# Patient Record
Sex: Male | Born: 1955
Health system: Southern US, Community
[De-identification: ages and names within clinical notes are randomized; demographics above are authoritative.]

## PROBLEM LIST (undated history)

## (undated) ENCOUNTER — Emergency Department (HOSPITAL_COMMUNITY): Admission: EM | Payer: Medicaid Other | Source: Home / Self Care

## (undated) DIAGNOSIS — B1920 Unspecified viral hepatitis C without hepatic coma: Secondary | ICD-10-CM

## (undated) DIAGNOSIS — Z8781 Personal history of (healed) traumatic fracture: Secondary | ICD-10-CM

## (undated) HISTORY — DX: Personal history of (healed) traumatic fracture: Z87.81

---

## 2015-05-20 ENCOUNTER — Ambulatory Visit: Payer: Self-pay | Attending: Internal Medicine

## 2016-03-23 ENCOUNTER — Ambulatory Visit: Payer: Self-pay | Attending: Internal Medicine

## 2016-07-06 ENCOUNTER — Encounter: Payer: Self-pay | Admitting: Family Medicine

## 2016-07-06 ENCOUNTER — Ambulatory Visit: Payer: Self-pay | Attending: Family Medicine | Admitting: Family Medicine

## 2016-07-06 VITALS — BP 112/62 | HR 95 | Temp 98.9°F | Resp 20 | Ht 64.0 in | Wt 148.0 lb

## 2016-07-06 DIAGNOSIS — Z0000 Encounter for general adult medical examination without abnormal findings: Secondary | ICD-10-CM | POA: Insufficient documentation

## 2016-07-06 DIAGNOSIS — E559 Vitamin D deficiency, unspecified: Secondary | ICD-10-CM

## 2016-07-06 DIAGNOSIS — Z590 Homelessness: Secondary | ICD-10-CM | POA: Insufficient documentation

## 2016-07-06 DIAGNOSIS — R768 Other specified abnormal immunological findings in serum: Secondary | ICD-10-CM

## 2016-07-06 DIAGNOSIS — Z23 Encounter for immunization: Secondary | ICD-10-CM

## 2016-07-06 DIAGNOSIS — K0889 Other specified disorders of teeth and supporting structures: Secondary | ICD-10-CM | POA: Insufficient documentation

## 2016-07-06 LAB — CBC WITH DIFFERENTIAL/PLATELET
BASOS PCT: 1 %
Basophils Absolute: 36 cells/uL (ref 0–200)
EOS ABS: 36 {cells}/uL (ref 15–500)
Eosinophils Relative: 1 %
HCT: 43.4 % (ref 38.5–50.0)
Hemoglobin: 14.9 g/dL (ref 13.2–17.1)
Lymphocytes Relative: 34 %
Lymphs Abs: 1224 cells/uL (ref 850–3900)
MCH: 31.8 pg (ref 27.0–33.0)
MCHC: 34.3 g/dL (ref 32.0–36.0)
MCV: 92.5 fL (ref 80.0–100.0)
MONO ABS: 360 {cells}/uL (ref 200–950)
MONOS PCT: 10 %
MPV: 10.3 fL (ref 7.5–12.5)
NEUTROS ABS: 1944 {cells}/uL (ref 1500–7800)
Neutrophils Relative %: 54 %
PLATELETS: 264 10*3/uL (ref 140–400)
RBC: 4.69 MIL/uL (ref 4.20–5.80)
RDW: 13.4 % (ref 11.0–15.0)
WBC: 3.6 10*3/uL — AB (ref 3.8–10.8)

## 2016-07-06 LAB — POCT URINALYSIS DIPSTICK
Bilirubin, UA: NEGATIVE
Glucose, UA: NEGATIVE
Ketones, UA: NEGATIVE
Leukocytes, UA: NEGATIVE
NITRITE UA: NEGATIVE
PH UA: 7.5
PROTEIN UA: NEGATIVE
RBC UA: NEGATIVE
SPEC GRAV UA: 1.02
UROBILINOGEN UA: 1

## 2016-07-06 LAB — BASIC METABOLIC PANEL
BUN: 10 mg/dL (ref 7–25)
CHLORIDE: 103 mmol/L (ref 98–110)
CO2: 29 mmol/L (ref 20–31)
CREATININE: 0.95 mg/dL (ref 0.70–1.33)
Calcium: 9.6 mg/dL (ref 8.6–10.3)
Glucose, Bld: 90 mg/dL (ref 65–99)
POTASSIUM: 4 mmol/L (ref 3.5–5.3)
Sodium: 139 mmol/L (ref 135–146)

## 2016-07-06 LAB — LIPID PANEL
Cholesterol: 254 mg/dL — ABNORMAL HIGH (ref <200)
HDL: 153 mg/dL (ref >40)
LDL Cholesterol: 89 mg/dL (ref <100)
TRIGLYCERIDES: 62 mg/dL (ref <150)
Total CHOL/HDL Ratio: 1.7 Ratio (ref <5.0)
VLDL: 12 mg/dL (ref <30)

## 2016-07-06 LAB — HEMOGLOBIN A1C
Hgb A1c MFr Bld: 5.3 % (ref <5.7)
MEAN PLASMA GLUCOSE: 105 mg/dL

## 2016-07-06 NOTE — Progress Notes (Signed)
Tooth concerns. Denies pain or issue.

## 2016-07-06 NOTE — Patient Instructions (Addendum)
Td Vaccine (Tetanus and Diphtheria): What You Need to Know 1. Why get vaccinated? Tetanus  and diphtheria are very serious diseases. They are rare in the [place] today, but people who do become infected often have severe complications. Td vaccine is used to protect adolescents and adults from both of these diseases. Both tetanus and diphtheria are infections caused by bacteria. Diphtheria spreads from person to person through coughing or sneezing. Tetanus-causing bacteria enter the body through cuts, scratches, or wounds. TETANUS (lockjaw) causes painful muscle tightening and stiffness, usually all over the body.  It can lead to tightening of muscles in the head and neck so you can't open your mouth, swallow, or sometimes even breathe. Tetanus kills about 1 out of every 10 people who are infected even after receiving the best medical care. DIPHTHERIA can cause a thick coating to form in the back of the throat.  It can lead to breathing problems, paralysis, heart failure, and death. Before vaccines, as many as 200,000 cases of diphtheria and hundreds of cases of tetanus were reported in the [place] each year. Since vaccination began, reports of cases for both diseases have dropped by about 99%. 2. Td vaccine Td vaccine can protect adolescents and adults from tetanus and diphtheria. Td is usually given as a booster dose every 10 years but it can also be given earlier after a severe and dirty wound or burn. Another vaccine, called Tdap, which protects against pertussis in addition to tetanus and diphtheria, is sometimes recommended instead of Td vaccine. Your doctor or the person giving you the vaccine can give you more information. Td may safely be given at the same time as other vaccines. 3. Some people should not get this vaccine  A person who has ever had a life-threatening allergic reaction after a previous dose of any tetanus or diphtheria containing vaccine, OR has a severe allergy  to any part of this vaccine, should not get Td vaccine. Tell the person giving the vaccine about any severe allergies.  Talk to your doctor if you:  had severe pain or swelling after any vaccine containing diphtheria or tetanus,  ever had a condition called Guillain Barre Syndrome (GBS),  aren't feeling well on the day the shot is scheduled. 4. What are the risks from Td vaccine? With any medicine, including vaccines, there is a chance of side effects. These are usually mild and go away on their own. Serious reactions are also possible but are rare. Most people who get Td vaccine do not have any problems with it. Mild problems following Td vaccine: (Did not interfere with activities)  Pain where the shot was given (about 8 people in 10)  Redness or swelling where the shot was given (about 1 person in 4)  Mild fever (rare)  Headache (about 1 person in 4)  Tiredness (about 1 person in 4) Moderate problems following Td vaccine: (Interfered with activities, but did not require medical attention)  Fever over 102F (rare) Severe problems following Td vaccine: (Unable to perform usual activities; required medical attention)  Swelling, severe pain, bleeding and/or redness in the arm where the shot was given (rare). Problems that could happen after any vaccine:  People sometimes faint after a medical procedure, including vaccination. Sitting or lying down for about 15 minutes can help prevent fainting, and injuries caused by a fall. Tell your doctor if you feel dizzy, or have vision changes or ringing in the ears.  Some people get severe pain in the shoulder  and have difficulty moving the arm where a shot was given. This happens very rarely.  Any medication can cause a severe allergic reaction. Such reactions from a vaccine are very rare, estimated at fewer than 1 in a million doses, and would happen within a few minutes to a few hours after the vaccination. As with any medicine, there  is a very remote chance of a vaccine causing a serious injury or death. The safety of vaccines is always being monitored. For more information, visit: http://floyd.org/www.cdc.gov/vaccinesafety/ 5. What if there is a serious reaction? What should I look for? Look for anything that concerns you, such as signs of a severe allergic reaction, very high fever, or unusual behavior. Signs of a severe allergic reaction can include hives, swelling of the face and throat, difficulty breathing, a fast heartbeat, dizziness, and weakness. These would usually start a few minutes to a few hours after the vaccination. What should I do?  If you think it is a severe allergic reaction or other emergency that can't wait, call 9-1-1 or get the person to the nearest hospital. Otherwise, call your doctor.  Afterward, the reaction should be reported to the Vaccine Adverse Event Reporting System (VAERS). Your doctor might file this report, or you can do it yourself through the VAERS web site at www.vaers.LAgents.nohhs.gov, or by calling 1-612 447 5296.  VAERS does not give medical advice. 6. The National Vaccine Injury Compensation Program The [employer] Vaccine Injury Compensation Program (VICP) is a federal program that was created to compensate people who may have been injured by certain vaccines. Persons who believe they may have been injured by a vaccine can learn about the program and about filing a claim by calling 1-(709)856-3700 or visiting the VICP website at SpiritualWord.atwww.hrsa.gov/vaccinecompensation. There is a time limit to file a claim for compensation. 7. How can I learn more?  Ask your doctor. He or she can give you the vaccine package insert or suggest other sources of information.  Call your local or state health department.  Contact the Centers for Disease Control and Prevention (CDC):  Call 801-565-73411-2190660364 (1-800-CDC-INFO)  Visit CDC's website at PicCapture.uywww.cdc.gov/vaccines CDC Td Vaccine VIS (11/19/15) This information is not intended to  replace advice given to you by your health care provider. Make sure you discuss any questions you have with your health care provider. Document Released: 05/24/2006 Document Revised: 04/16/2016 Document Reviewed: 04/16/2016 Elsevier Interactive Patient Education  2017 Elsevier Inc.   Colonoscopy, Adult A colonoscopy is an exam to look at the large intestine. It is done to check for problems, such as:  Lumps (tumors).  Growths (polyps).  Swelling (inflammation).  Bleeding. What happens before the procedure? Eating and drinking Follow instructions from your doctor about eating and drinking. These instructions may include:  A few days before the procedure - follow a low-fiber diet.  Avoid nuts.  Avoid seeds.  Avoid dried fruit.  Avoid raw fruits.  Avoid vegetables.  1-3 days before the procedure - follow a clear liquid diet. Avoid liquids that have red or purple dye. Drink only clear liquids, such as:  Clear broth or bouillon.  Black coffee or tea.  Clear juice.  Clear soft drinks or sports drinks.  [place] desert.  Popsicles.  On the day of the procedure - do not eat or drink anything during the 2 hours before the procedure. Bowel prep If you were prescribed an oral bowel prep:  Take it as told by your doctor. Starting the day before your procedure, you will  need to drink a lot of liquid. The liquid will cause you to poop (have bowel movements) until your poop is almost clear or light green.  If your skin or butt gets irritated from diarrhea, you may:  Wipe the area with wipes that have medicine in them, such as adult wet wipes with aloe and vitamin E.  Put something on your skin that soothes the area, such as petroleum jelly.  If you throw up (vomit) while drinking the bowel prep, take a break for up to 60 minutes. Then begin the bowel prep again. If you keep throwing up and you cannot take the bowel prep without throwing up, call your doctor. General  instructions  Ask your doctor about changing or stopping your normal medicines. This is important if you take diabetes medicines or blood thinners.  Plan to have someone take you home from the hospital or clinic. What happens during the procedure?  An IV tube may be put into one of your veins.  You will be given medicine to help you relax (sedative).  To reduce your risk of infection:  Your doctors will wash their hands.  Your anal area will be washed with soap.  You will be asked to lie on your side with your knees bent.  Your doctor will get a long, thin, flexible tube ready. The tube will have a camera and a light on the end.  The tube will be put into your anus.  The tube will be gently put into your large intestine.  Air will be delivered into your large intestine to keep it open. You may feel some pressure or cramping.  The camera will be used to take photos.  A small tissue sample may be removed from your body to be looked at under a microscope (biopsy). If any possible problems are found, the tissue will be sent to a lab for testing.  If small growths are found, your doctor may remove them and have them checked for cancer.  The tube that was put into your anus will be slowly removed. The procedure may vary among doctors and hospitals. What happens after the procedure?  Your doctor will check on you often until the medicines you were given have worn off.  Do not drive for 24 hours after the procedure.  You may have a small amount of blood in your poop.  You may pass gas.  You may have mild cramps or bloating in your belly (abdomen).  It is up to you to get the results of your procedure. Ask your doctor, or the department performing the procedure, when your results will be ready. This information is not intended to replace advice given to you by your health care provider. Make sure you discuss any questions you have with your health care provider. Document  Released: 08/29/2010 Document Revised: 04/02/2016 Document Reviewed: 10/08/2015 Elsevier Interactive Patient Education  2017 [employer]

## 2016-07-07 ENCOUNTER — Encounter: Payer: Self-pay | Admitting: Family Medicine

## 2016-07-07 LAB — HIV ANTIBODY (ROUTINE TESTING W REFLEX): HIV: NONREACTIVE

## 2016-07-07 LAB — VITAMIN D 25 HYDROXY (VIT D DEFICIENCY, FRACTURES): VIT D 25 HYDROXY: 29 ng/mL — AB (ref 30–100)

## 2016-07-07 LAB — TSH: TSH: 1.76 mIU/L (ref 0.40–4.50)

## 2016-07-07 LAB — HEPATITIS C ANTIBODY: HCV Ab: REACTIVE — AB

## 2016-07-07 NOTE — Progress Notes (Signed)
Subjective:   Patient ID: [name], male    DOB: 1956/04/24, 60 y.o.   MRN: [id number]  Chief Complaint  Patient presents with  . Annual Exam  . Dental Problem    loose teeth   HPI [name] 60 y.o. male comes to office today for physical exam and complaint of loose teeth. He reports his left upper teeth are loose. He denies any tooth pain. He denies any swelling or drainage. He denies any difficulty swallowing. He is requesting a dental referral.   Past Medical History:  Diagnosis Date  . History of left-sided rib fracture.      Family History  Problem Relation Age of Onset  . Diabetes Neg Hx   . Hypertension Neg Hx     Social History   Social History Narrative  . Patient reports recently leaving prison. He currently homeless and reports living in homeless shelter.    No outpatient prescriptions prior to visit.   No facility-administered medications prior to visit.    No Known Allergies  Review of Systems  Constitutional: Negative.   HENT:       Patient reports left upper teeth loose.  Eyes: Negative.   Respiratory: Negative.   Cardiovascular: Negative.   Gastrointestinal: Negative.   Genitourinary: Negative.   Musculoskeletal: Negative.   Skin: Negative.   Neurological: Negative.   Psychiatric/Behavioral: Negative for substance abuse and suicidal ideas.     Objective:    Physical Exam  Constitutional: He is oriented to person, place, and time. He appears well-developed and well-nourished.  HENT:  Mouth/Throat: Uvula is midline, oropharynx is clear and moist and mucous membranes are normal. Abnormal dentition (Left upper canines and premolars.).  Eyes: Conjunctivae and EOM are normal. Pupils are equal, round, and reactive to light. Right eye exhibits no discharge. Left eye exhibits no discharge. No scleral icterus.  Neck: Normal range of motion. Neck supple. No JVD present.  Cardiovascular: Normal rate, regular rhythm and normal heart sounds.     Pulmonary/Chest: Effort normal and breath sounds normal.  Abdominal: Soft. Bowel sounds are normal. He exhibits no distension and no mass. There is no tenderness.  Musculoskeletal: Normal range of motion. He exhibits no edema or tenderness.  Lymphadenopathy:    He has no cervical adenopathy.  Neurological: He is alert and oriented to person, place, and time. He has normal reflexes. He displays no Babinski's sign on the right side. He displays no Babinski's sign on the left side.  Skin: Skin is warm and dry.  Psychiatric: He has a normal mood and affect. His behavior is normal. Thought content normal.    BP 112/62   Pulse 95   Temp 98.9 F (37.2 C) (Oral)   Resp 20   Ht 5\' 4"  (1.626 m)   Wt 148 lb (67.1 kg)   SpO2 96%   BMI 25.40 kg/m  Wt Readings from Last 3 Encounters:  07/06/16 148 lb (67.1 kg)    Immunization History  Administered Date(s) Administered  . Tdap 07/06/2016       Assessment & Plan:   Problem List Items Addressed This Visit    None    Visit Diagnoses    Physical exam, annual    -  Primary   Relevant Orders   Hemoglobin A1c   HIV antibody (with reflex)   Basic Metabolic Panel   CBC with Differential   Lipid Panel   Vitamin D, 25-hydroxy   TSH   Hepatitis C Antibody   Ambulatory referral  to Gastroenterology   Urinalysis - Glucose/Protein   Tdap vaccine greater than or equal to 7yo IM (Completed)   Urinalysis Dipstick (Completed)   Loose, teeth       Relevant Orders   Ambulatory referral to Dentistry      Mr. Jean RosenthalJackson does not currently have medications on file. He denies any current medication use.  No medication orders were placed during this visit.     Arrie SenateMandesia Marche Hottenstein, FNP

## 2016-07-08 ENCOUNTER — Encounter: Payer: Self-pay | Admitting: Family Medicine

## 2016-07-09 LAB — HEPATITIS C RNA QUANTITATIVE
HCV QUANT LOG: 6.18 {Log} — AB (ref <1.18)
HCV Quantitative: 1520614 IU/mL — ABNORMAL HIGH (ref <15)

## 2016-07-10 MED ORDER — CALCIUM 600-400 MG-UNIT PO CHEW: 1.0000 | CHEWABLE_TABLET | Freq: Every day | ORAL | 2 refills | Status: DC

## 2016-07-13 ENCOUNTER — Ambulatory Visit: Payer: Self-pay | Attending: Internal Medicine

## 2016-07-13 MED ORDER — CALCIUM 600-400 MG-UNIT PO CHEW: 1.0000 | CHEWABLE_TABLET | Freq: Every day | ORAL | 2 refills | Status: DC

## 2016-07-14 ENCOUNTER — Telehealth: Payer: Self-pay

## 2016-07-14 NOTE — Telephone Encounter (Addendum)
[facility]    Attempting to advise patient Lizbeth BarkMandesia R Hairston, FNP ----- -Hepatitis C was positive. This indicates Hepatitis C Infection. -Hepatitis C is caused by a virus which can spread from person to person through contact with blood. This can happen through sex or sharing needles. -Avoid having unprotected sex or any other activity that would expose others to blood to prevent spreading the virus. -Hepatitis C is treatable. You will be referred to Infectious Disease for follow up. -HIV test was negative. -Vitamin D level was low. Vitamin D helps to keep bones strong. You were prescribed a vitamin- d supplement to increase your   levels. -Cholesterol levels were slightly elevated. Start eating a diet low in saturated fat. Limit your intake of fried foods, red meats, and   whole milk.

## 2016-07-17 ENCOUNTER — Telehealth: Payer: Self-pay

## 2016-07-17 NOTE — Telephone Encounter (Addendum)
Patient fully hipaa verified.  [facility].  Hepatitis C was positive. This indicates Hepatitis C Infection. -Hepatitis C is caused by a virus which can spread from person to person through contact with blood. This can happen through sex or sharing needles. -Avoid having unprotected sex or any other activity that would expose others to blood to prevent spreading the virus. -Hepatitis C is treatable. You will be referred to Infectious Disease for follow up. -HIV test was negative. -Vitamin D level was low. Vitamin D helps to keep bones strong. You were prescribed a vitamin- d supplement to increase your   levels. -Cholesterol levels were slightly elevated. Start eating a diet low in saturated fat. Limit your intake of fried foods, red meats, and   whole milk.   Patient verbalized understanding.

## 2017-01-27 ENCOUNTER — Telehealth: Payer: Self-pay

## 2017-01-27 NOTE — Telephone Encounter (Signed)
PT's letter to schedule colonoscopy was returned that was mailed to [address] [place]  I tried to call, LMOM at home for a return call.

## 2017-03-31 ENCOUNTER — Ambulatory Visit: Payer: Self-pay | Attending: Family Medicine

## 2017-10-14 ENCOUNTER — Inpatient Hospital Stay (HOSPITAL_COMMUNITY)
Admission: EM | Admit: 2017-10-14 | Discharge: 2017-10-18 | DRG: 494 | Disposition: A | Discharge status: Home / Self Care | Payer: Medicaid Other | Attending: Student | Admitting: Student

## 2017-10-14 ENCOUNTER — Emergency Department (HOSPITAL_COMMUNITY): Payer: Medicaid Other

## 2017-10-14 ENCOUNTER — Other Ambulatory Visit: Payer: Self-pay

## 2017-10-14 ENCOUNTER — Observation Stay (HOSPITAL_COMMUNITY): Payer: Medicaid Other

## 2017-10-14 ENCOUNTER — Encounter (HOSPITAL_COMMUNITY): Payer: Self-pay | Admitting: Emergency Medicine

## 2017-10-14 DIAGNOSIS — Z419 Encounter for procedure for purposes other than remedying health state, unspecified: Secondary | ICD-10-CM

## 2017-10-14 DIAGNOSIS — T148XXA Other injury of unspecified body region, initial encounter: Secondary | ICD-10-CM

## 2017-10-14 DIAGNOSIS — IMO0001 Reserved for inherently not codable concepts without codable children: Secondary | ICD-10-CM

## 2017-10-14 DIAGNOSIS — Y9289 Other specified places as the place of occurrence of the external cause: Secondary | ICD-10-CM

## 2017-10-14 DIAGNOSIS — F1410 Cocaine abuse, uncomplicated: Secondary | ICD-10-CM | POA: Diagnosis present

## 2017-10-14 DIAGNOSIS — F14120 Cocaine abuse with intoxication, uncomplicated: Secondary | ICD-10-CM

## 2017-10-14 DIAGNOSIS — Z590 Homelessness: Secondary | ICD-10-CM

## 2017-10-14 DIAGNOSIS — S9305XA Dislocation of left ankle joint, initial encounter: Secondary | ICD-10-CM | POA: Diagnosis present

## 2017-10-14 DIAGNOSIS — F10129 Alcohol abuse with intoxication, unspecified: Secondary | ICD-10-CM

## 2017-10-14 DIAGNOSIS — S82852A Displaced trimalleolar fracture of left lower leg, initial encounter for closed fracture: Principal | ICD-10-CM | POA: Diagnosis present

## 2017-10-14 LAB — CBC WITH DIFFERENTIAL/PLATELET
Basophils Absolute: 0 10*3/uL (ref 0.0–0.1)
Basophils Relative: 1 %
EOS PCT: 0 %
Eosinophils Absolute: 0 10*3/uL (ref 0.0–0.7)
HCT: 42.4 % (ref 39.0–52.0)
Hemoglobin: 14.9 g/dL (ref 13.0–17.0)
LYMPHS PCT: 19 %
Lymphs Abs: 1.2 10*3/uL (ref 0.7–4.0)
MCH: 32.9 pg (ref 26.0–34.0)
MCHC: 35.1 g/dL (ref 30.0–36.0)
MCV: 93.6 fL (ref 78.0–100.0)
MONOS PCT: 6 %
Monocytes Absolute: 0.4 10*3/uL (ref 0.1–1.0)
Neutro Abs: 4.6 10*3/uL (ref 1.7–7.7)
Neutrophils Relative %: 74 %
PLATELETS: 255 10*3/uL (ref 150–400)
RBC: 4.53 MIL/uL (ref 4.22–5.81)
RDW: 12.5 % (ref 11.5–15.5)
WBC: 6.3 10*3/uL (ref 4.0–10.5)

## 2017-10-14 LAB — COMPREHENSIVE METABOLIC PANEL
ALT: 27 U/L (ref 17–63)
AST: 29 U/L (ref 15–41)
Albumin: 3.9 g/dL (ref 3.5–5.0)
Alkaline Phosphatase: 83 U/L (ref 38–126)
Anion gap: 13 (ref 5–15)
BUN: 9 mg/dL (ref 6–20)
CHLORIDE: 105 mmol/L (ref 101–111)
CO2: 24 mmol/L (ref 22–32)
CREATININE: 1.03 mg/dL (ref 0.61–1.24)
Calcium: 9.3 mg/dL (ref 8.9–10.3)
Glucose, Bld: 97 mg/dL (ref 65–99)
POTASSIUM: 3.8 mmol/L (ref 3.5–5.1)
Sodium: 142 mmol/L (ref 135–145)
Total Bilirubin: 0.6 mg/dL (ref 0.3–1.2)
Total Protein: 7.4 g/dL (ref 6.5–8.1)

## 2017-10-14 LAB — PROTIME-INR
INR: 0.99
PROTHROMBIN TIME: 13 s (ref 11.4–15.2)

## 2017-10-14 LAB — ETHANOL: Alcohol, Ethyl (B): 156 mg/dL — ABNORMAL HIGH (ref <10)

## 2017-10-14 MED ORDER — LORAZEPAM 2 MG/ML IJ SOLN: 0.0000 mg | Freq: Two times a day (BID) | INTRAMUSCULAR | Status: DC

## 2017-10-14 MED ORDER — FENTANYL CITRATE (PF) 100 MCG/2ML IJ SOLN
50.0000 ug | Freq: Once | INTRAMUSCULAR | Status: AC
  Administered 2017-10-14: 50 ug via INTRAVENOUS
  Filled 2017-10-14: qty 2

## 2017-10-14 MED ORDER — TETANUS-DIPHTHERIA TOXOIDS TD 5-2 LFU IM INJ
0.5000 mL | INJECTION | Freq: Once | INTRAMUSCULAR | Status: AC
  Administered 2017-10-14: 0.5 mL via INTRAMUSCULAR
  Filled 2017-10-14: qty 0.5

## 2017-10-14 MED ORDER — KETAMINE HCL-SODIUM CHLORIDE 100-0.9 MG/10ML-% IV SOSY
2.0000 mg/kg | PREFILLED_SYRINGE | Freq: Once | INTRAVENOUS | Status: AC
  Administered 2017-10-14: 60 mg via INTRAVENOUS
  Filled 2017-10-14: qty 20

## 2017-10-14 MED ORDER — LORAZEPAM 1 MG PO TABS: 0.0000 mg | ORAL_TABLET | Freq: Two times a day (BID) | ORAL | Status: DC

## 2017-10-14 MED ORDER — THIAMINE HCL 100 MG/ML IJ SOLN: 100.0000 mg | Freq: Every day | INTRAMUSCULAR | Status: DC

## 2017-10-14 MED ORDER — SODIUM CHLORIDE 0.9 % IV BOLUS (SEPSIS)
1000.0000 mL | Freq: Once | INTRAVENOUS | Status: AC
  Administered 2017-10-14: 1000 mL via INTRAVENOUS

## 2017-10-14 MED ORDER — FENTANYL CITRATE (PF) 100 MCG/2ML IJ SOLN
50.0000 ug | Freq: Once | INTRAMUSCULAR | Status: AC
  Administered 2017-10-14 – 2017-10-15 (×2): 50 ug via INTRAVENOUS
  Filled 2017-10-14: qty 2

## 2017-10-14 MED ORDER — LORAZEPAM 2 MG/ML IJ SOLN: 0.0000 mg | Freq: Four times a day (QID) | INTRAMUSCULAR | Status: AC

## 2017-10-14 MED ORDER — ONDANSETRON HCL 4 MG/2ML IJ SOLN
4.0000 mg | Freq: Once | INTRAMUSCULAR | Status: AC
  Administered 2017-10-14: 4 mg via INTRAVENOUS
  Filled 2017-10-14: qty 2

## 2017-10-14 MED ORDER — VITAMIN B-1 100 MG PO TABS: 100.0000 mg | ORAL_TABLET | Freq: Every day | ORAL | Status: DC

## 2017-10-14 MED ORDER — LORAZEPAM 1 MG PO TABS
0.0000 mg | ORAL_TABLET | Freq: Four times a day (QID) | ORAL | Status: AC
  Administered 2017-10-15: 1 mg via ORAL
  Filled 2017-10-14: qty 2

## 2017-10-14 NOTE — ED Notes (Signed)
Pt states he was smoking crack during incident
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
## 2017-10-14 NOTE — ED Notes (Signed)
Patient transported to X-ray 
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
## 2017-10-15 ENCOUNTER — Observation Stay (HOSPITAL_COMMUNITY): Payer: Medicaid Other

## 2017-10-15 ENCOUNTER — Encounter (HOSPITAL_COMMUNITY): Payer: Self-pay | Admitting: Surgery

## 2017-10-15 ENCOUNTER — Observation Stay (HOSPITAL_COMMUNITY): Payer: Medicaid Other | Admitting: Certified Registered Nurse Anesthetist

## 2017-10-15 ENCOUNTER — Encounter (HOSPITAL_COMMUNITY)
Admission: EM | Disposition: A | Discharge status: Home / Self Care | Payer: Self-pay | Source: Home / Self Care | Attending: Student

## 2017-10-15 HISTORY — PX: ORIF ANKLE FRACTURE: SHX5408

## 2017-10-15 LAB — SURGICAL PCR SCREEN
MRSA, PCR: NEGATIVE
Staphylococcus aureus: NEGATIVE

## 2017-10-15 LAB — RAPID URINE DRUG SCREEN, HOSP PERFORMED
Amphetamines: NOT DETECTED
Barbiturates: NOT DETECTED
Benzodiazepines: NOT DETECTED
Cocaine: POSITIVE — AB
Opiates: NOT DETECTED
Tetrahydrocannabinol: NOT DETECTED

## 2017-10-15 LAB — HIV ANTIBODY (ROUTINE TESTING W REFLEX): HIV SCREEN 4TH GENERATION: NONREACTIVE

## 2017-10-15 SURGERY — OPEN REDUCTION INTERNAL FIXATION (ORIF) ANKLE FRACTURE
Anesthesia: Regional | Site: Ankle | Laterality: Left

## 2017-10-15 MED ORDER — ONDANSETRON HCL 4 MG/2ML IJ SOLN: 4.0000 mg | Freq: Four times a day (QID) | INTRAMUSCULAR | Status: DC | PRN

## 2017-10-15 MED ORDER — OXYCODONE HCL 5 MG PO TABS: 5.0000 mg | ORAL_TABLET | Freq: Once | ORAL | Status: DC | PRN

## 2017-10-15 MED ORDER — MIDAZOLAM HCL 5 MG/5ML IJ SOLN
INTRAMUSCULAR | Status: DC | PRN
  Administered 2017-10-15: 1 mg via INTRAVENOUS

## 2017-10-15 MED ORDER — FENTANYL CITRATE (PF) 100 MCG/2ML IJ SOLN
INTRAMUSCULAR | Status: AC
  Administered 2017-10-15: 50 ug via INTRAVENOUS
  Filled 2017-10-15: qty 2

## 2017-10-15 MED ORDER — LACTATED RINGERS IV SOLN
INTRAVENOUS | Status: DC
  Administered 2017-10-15 (×2): via INTRAVENOUS

## 2017-10-15 MED ORDER — PROPOFOL 10 MG/ML IV BOLUS
INTRAVENOUS | Status: AC
Start: 2017-10-15 — End: ?
  Filled 2017-10-15: qty 20

## 2017-10-15 MED ORDER — POVIDONE-IODINE 10 % EX SWAB: 2.0000 "application " | Freq: Once | CUTANEOUS | Status: DC

## 2017-10-15 MED ORDER — ONDANSETRON HCL 4 MG/2ML IJ SOLN: 4.0000 mg | Freq: Once | INTRAMUSCULAR | Status: DC | PRN

## 2017-10-15 MED ORDER — DIPHENHYDRAMINE HCL 12.5 MG/5ML PO ELIX: 12.5000 mg | ORAL_SOLUTION | ORAL | Status: DC | PRN

## 2017-10-15 MED ORDER — CHLORHEXIDINE GLUCONATE 4 % EX LIQD: 60.0000 mL | Freq: Once | CUTANEOUS | Status: DC

## 2017-10-15 MED ORDER — FENTANYL CITRATE (PF) 250 MCG/5ML IJ SOLN
INTRAMUSCULAR | Status: AC
  Filled 2017-10-15: qty 5

## 2017-10-15 MED ORDER — BACITRACIN ZINC 500 UNIT/GM EX OINT
TOPICAL_OINTMENT | CUTANEOUS | Status: AC
  Filled 2017-10-15: qty 28.35

## 2017-10-15 MED ORDER — ONDANSETRON HCL 4 MG PO TABS
4.0000 mg | ORAL_TABLET | Freq: Four times a day (QID) | ORAL | Status: DC | PRN
Start: 2017-10-15 — End: 2017-10-19

## 2017-10-15 MED ORDER — MIDAZOLAM HCL 2 MG/2ML IJ SOLN
INTRAMUSCULAR | Status: AC
Start: 2017-10-15 — End: ?
  Filled 2017-10-15: qty 2

## 2017-10-15 MED ORDER — PROPOFOL 10 MG/ML IV BOLUS
INTRAVENOUS | Status: AC
  Filled 2017-10-15: qty 20

## 2017-10-15 MED ORDER — LIDOCAINE HCL (CARDIAC) 20 MG/ML IV SOLN
INTRAVENOUS | Status: DC | PRN
  Administered 2017-10-15: 20 mg via INTRAVENOUS

## 2017-10-15 MED ORDER — CEFAZOLIN SODIUM-DEXTROSE 2-4 GM/100ML-% IV SOLN
2.0000 g | INTRAVENOUS | Status: AC
  Administered 2017-10-15: 2 g via INTRAVENOUS
  Filled 2017-10-15 (×2): qty 100

## 2017-10-15 MED ORDER — METHOCARBAMOL 1000 MG/10ML IJ SOLN: 500.0000 mg | Freq: Four times a day (QID) | INTRAVENOUS | Status: DC | PRN

## 2017-10-15 MED ORDER — LACTATED RINGERS IV SOLN
INTRAVENOUS | Status: DC | PRN
  Administered 2017-10-15: 12:00:00 via INTRAVENOUS

## 2017-10-15 MED ORDER — PHENYLEPHRINE HCL 10 MG/ML IJ SOLN
INTRAMUSCULAR | Status: DC | PRN
  Administered 2017-10-15: 40 ug via INTRAVENOUS
  Administered 2017-10-15: 80 ug via INTRAVENOUS

## 2017-10-15 MED ORDER — OXYCODONE HCL 5 MG PO TABS
5.0000 mg | ORAL_TABLET | ORAL | Status: DC | PRN
  Administered 2017-10-15 – 2017-10-17 (×4): 10 mg via ORAL
  Filled 2017-10-15 (×4): qty 2

## 2017-10-15 MED ORDER — ENOXAPARIN SODIUM 40 MG/0.4ML ~~LOC~~ SOLN
40.0000 mg | SUBCUTANEOUS | Status: DC
  Administered 2017-10-16 – 2017-10-17 (×2): 40 mg via SUBCUTANEOUS
  Filled 2017-10-15 (×2): qty 0.4

## 2017-10-15 MED ORDER — SUGAMMADEX SODIUM 200 MG/2ML IV SOLN
INTRAVENOUS | Status: DC | PRN
  Administered 2017-10-15: 263.2 mg via INTRAVENOUS

## 2017-10-15 MED ORDER — THIAMINE HCL 100 MG/ML IJ SOLN: 100.0000 mg | Freq: Every day | INTRAMUSCULAR | Status: DC

## 2017-10-15 MED ORDER — ADULT MULTIVITAMIN W/MINERALS CH
1.0000 | ORAL_TABLET | Freq: Every day | ORAL | Status: DC
  Administered 2017-10-16 – 2017-10-18 (×3): 1 via ORAL
  Filled 2017-10-15 (×3): qty 1

## 2017-10-15 MED ORDER — ONDANSETRON HCL 4 MG/2ML IJ SOLN
INTRAMUSCULAR | Status: DC | PRN
  Administered 2017-10-15: 4 mg via INTRAVENOUS

## 2017-10-15 MED ORDER — ROCURONIUM BROMIDE 100 MG/10ML IV SOLN
INTRAVENOUS | Status: DC | PRN
  Administered 2017-10-15: 50 mg via INTRAVENOUS

## 2017-10-15 MED ORDER — PROPOFOL 10 MG/ML IV BOLUS
INTRAVENOUS | Status: DC | PRN
  Administered 2017-10-15: 160 mg via INTRAVENOUS

## 2017-10-15 MED ORDER — CEFAZOLIN SODIUM-DEXTROSE 2-4 GM/100ML-% IV SOLN
2.0000 g | Freq: Three times a day (TID) | INTRAVENOUS | Status: AC
  Administered 2017-10-15 – 2017-10-16 (×3): 2 g via INTRAVENOUS
  Filled 2017-10-15 (×3): qty 100

## 2017-10-15 MED ORDER — ACETAMINOPHEN 325 MG PO TABS
650.0000 mg | ORAL_TABLET | Freq: Four times a day (QID) | ORAL | Status: DC | PRN
  Administered 2017-10-18: 650 mg via ORAL
  Filled 2017-10-15: qty 2

## 2017-10-15 MED ORDER — VITAMIN B-1 100 MG PO TABS
100.0000 mg | ORAL_TABLET | Freq: Every day | ORAL | Status: DC
  Administered 2017-10-16 – 2017-10-18 (×3): 100 mg via ORAL
  Filled 2017-10-15 (×3): qty 1

## 2017-10-15 MED ORDER — DOCUSATE SODIUM 100 MG PO CAPS
100.0000 mg | ORAL_CAPSULE | Freq: Two times a day (BID) | ORAL | Status: DC
  Administered 2017-10-15 – 2017-10-18 (×6): 100 mg via ORAL
  Filled 2017-10-15 (×6): qty 1

## 2017-10-15 MED ORDER — LORAZEPAM 1 MG PO TABS: 1.0000 mg | ORAL_TABLET | Freq: Four times a day (QID) | ORAL | Status: AC | PRN

## 2017-10-15 MED ORDER — OXYCODONE HCL 5 MG/5ML PO SOLN: 5.0000 mg | Freq: Once | ORAL | Status: DC | PRN

## 2017-10-15 MED ORDER — 0.9 % SODIUM CHLORIDE (POUR BTL) OPTIME
TOPICAL | Status: DC | PRN
Start: 2017-10-15 — End: 2017-10-15
  Administered 2017-10-15: 1000 mL

## 2017-10-15 MED ORDER — MORPHINE SULFATE (PF) 2 MG/ML IV SOLN: 2.0000 mg | INTRAVENOUS | Status: DC | PRN

## 2017-10-15 MED ORDER — FOLIC ACID 1 MG PO TABS
1.0000 mg | ORAL_TABLET | Freq: Every day | ORAL | Status: DC
  Administered 2017-10-16 – 2017-10-18 (×3): 1 mg via ORAL
  Filled 2017-10-15 (×3): qty 1

## 2017-10-15 MED ORDER — ACETAMINOPHEN 650 MG RE SUPP: 650.0000 mg | Freq: Four times a day (QID) | RECTAL | Status: DC | PRN

## 2017-10-15 MED ORDER — PHENYLEPHRINE HCL 10 MG/ML IJ SOLN
INTRAVENOUS | Status: DC | PRN
  Administered 2017-10-15: 25 ug/min via INTRAVENOUS

## 2017-10-15 MED ORDER — MIDAZOLAM HCL 2 MG/2ML IJ SOLN
INTRAMUSCULAR | Status: AC
  Administered 2017-10-15: 1 mg
  Filled 2017-10-15: qty 2

## 2017-10-15 MED ORDER — VANCOMYCIN HCL 1000 MG IV SOLR
INTRAVENOUS | Status: AC
  Filled 2017-10-15: qty 1000

## 2017-10-15 MED ORDER — FENTANYL CITRATE (PF) 250 MCG/5ML IJ SOLN
INTRAMUSCULAR | Status: DC | PRN
  Administered 2017-10-15: 100 ug via INTRAVENOUS

## 2017-10-15 MED ORDER — METHOCARBAMOL 500 MG PO TABS
500.0000 mg | ORAL_TABLET | Freq: Four times a day (QID) | ORAL | Status: DC | PRN
  Administered 2017-10-15 – 2017-10-16 (×3): 500 mg via ORAL
  Filled 2017-10-15 (×3): qty 1

## 2017-10-15 MED ORDER — FENTANYL CITRATE (PF) 100 MCG/2ML IJ SOLN: 25.0000 ug | INTRAMUSCULAR | Status: DC | PRN

## 2017-10-15 MED ORDER — LORAZEPAM 2 MG/ML IJ SOLN: 1.0000 mg | Freq: Four times a day (QID) | INTRAMUSCULAR | Status: AC | PRN

## 2017-10-15 SURGICAL SUPPLY — 66 items
BANDAGE ACE 3X5.8 VEL STRL LF (GAUZE/BANDAGES/DRESSINGS) ×2 IMPLANT
BANDAGE ACE 4X5 VEL STRL LF (GAUZE/BANDAGES/DRESSINGS) IMPLANT
BANDAGE ACE 6X5 VEL STRL LF (GAUZE/BANDAGES/DRESSINGS) IMPLANT
BANDAGE ESMARK 6X9 LF (GAUZE/BANDAGES/DRESSINGS) ×1 IMPLANT
BAR EXFX 220X4 CNCT MRI (Rod) ×1 IMPLANT
BIT DRILL 2.5X110 QC LCP DISP (BIT) ×2 IMPLANT
BIT DRILL CALIBRATED 1.8MM (BIT) IMPLANT
BIT DRILL QC 3.5X195 (BIT) ×4 IMPLANT
BNDG CMPR 9X6 STRL LF SNTH (GAUZE/BANDAGES/DRESSINGS) ×1
BNDG COHESIVE 4X5 TAN STRL (GAUZE/BANDAGES/DRESSINGS) ×3 IMPLANT
BNDG CONFORM 2 STRL LF (GAUZE/BANDAGES/DRESSINGS) ×2 IMPLANT
BNDG ESMARK 6X9 LF (GAUZE/BANDAGES/DRESSINGS) ×3
BNDG GAUZE ELAST 4 BULKY (GAUZE/BANDAGES/DRESSINGS) ×2 IMPLANT
BRUSH SCRUB SURG 4.25 DISP (MISCELLANEOUS) ×6 IMPLANT
CHLORAPREP W/TINT 26ML (MISCELLANEOUS) ×3 IMPLANT
CLAMP COMBI 8.0/11.0 LRG/MED (Clamp) ×4 IMPLANT
CLAMP COMBO MED CLIP-ON SELF (Clamp) ×8 IMPLANT
COVER MAYO STAND STRL (DRAPES) ×3 IMPLANT
COVER SURGICAL LIGHT HANDLE (MISCELLANEOUS) ×3 IMPLANT
DRAPE C-ARM 42X72 X-RAY (DRAPES) ×3 IMPLANT
DRAPE C-ARMOR (DRAPES) ×3 IMPLANT
DRAPE HALF SHEET 40X57 (DRAPES) ×6 IMPLANT
DRAPE ORTHO SPLIT 77X108 STRL (DRAPES) ×6
DRAPE SURG ORHT 6 SPLT 77X108 (DRAPES) ×2 IMPLANT
DRAPE U-SHAPE 47X51 STRL (DRAPES) ×3 IMPLANT
DRILL CALIBRATED 1.8MM (BIT) ×3
DRSG EMULSION OIL 3X3 NADH (GAUZE/BANDAGES/DRESSINGS) IMPLANT
ELECT REM PT RETURN 9FT ADLT (ELECTROSURGICAL) ×3
ELECTRODE REM PT RTRN 9FT ADLT (ELECTROSURGICAL) ×1 IMPLANT
GAUZE SPONGE 4X4 12PLY STRL (GAUZE/BANDAGES/DRESSINGS) IMPLANT
GLOVE BIO SURGEON STRL SZ7.5 (GLOVE) ×12 IMPLANT
GLOVE BIOGEL PI IND STRL 7.5 (GLOVE) ×1 IMPLANT
GLOVE BIOGEL PI INDICATOR 7.5 (GLOVE) ×2
GOWN STRL REUS W/ TWL LRG LVL3 (GOWN DISPOSABLE) ×2 IMPLANT
GOWN STRL REUS W/TWL LRG LVL3 (GOWN DISPOSABLE) ×6
KIT ROOM TURNOVER OR (KITS) ×3 IMPLANT
MANIFOLD NEPTUNE II (INSTRUMENTS) ×3 IMPLANT
NDL HYPO 21X1.5 SAFETY (NEEDLE) IMPLANT
NEEDLE HYPO 21X1.5 SAFETY (NEEDLE) IMPLANT
NS IRRIG 1000ML POUR BTL (IV SOLUTION) ×3 IMPLANT
PACK TOTAL JOINT (CUSTOM PROCEDURE TRAY) ×3 IMPLANT
PAD ARMBOARD 7.5X6 YLW CONV (MISCELLANEOUS) ×6 IMPLANT
PAD CAST 4YDX4 CTTN HI CHSV (CAST SUPPLIES) IMPLANT
PADDING CAST COTTON 4X4 STRL (CAST SUPPLIES)
PADDING CAST COTTON 6X4 STRL (CAST SUPPLIES) IMPLANT
PADDING UNDERCAST 2 STRL (CAST SUPPLIES) ×2
PADDING UNDERCAST 2X4 STRL (CAST SUPPLIES) IMPLANT
PIN SHANZ TRANSFIX 6.0X225 (PIN) ×2 IMPLANT
ROD CARBON FIBER 8.0MMX200MM (Rod) ×2 IMPLANT
ROD CRBN FBR 8.0X120 (Rod) ×2 IMPLANT
SCREW CORTEX SLFTPNG 34MM 2.4 (Screw) ×2 IMPLANT
SCREW SHANZ 4.0X80MM (Screw) ×4 IMPLANT
SPONGE LAP 18X18 X RAY DECT (DISPOSABLE) ×3 IMPLANT
STAPLER VISISTAT 35W (STAPLE) ×3 IMPLANT
SUCTION FRAZIER HANDLE 10FR (MISCELLANEOUS) ×2
SUCTION TUBE FRAZIER 10FR DISP (MISCELLANEOUS) ×1 IMPLANT
SUT ETHILON 3 0 PS 1 (SUTURE) ×6 IMPLANT
SUT PROLENE 0 CT (SUTURE) IMPLANT
SUT VIC AB 2-0 CT1 27 (SUTURE) ×6
SUT VIC AB 2-0 CT1 TAPERPNT 27 (SUTURE) ×2 IMPLANT
TOWEL OR 17X24 6PK STRL BLUE (TOWEL DISPOSABLE) ×3 IMPLANT
TOWEL OR 17X26 10 PK STRL BLUE (TOWEL DISPOSABLE) ×6 IMPLANT
TUBE CONNECTING 12'X1/4 (SUCTIONS) ×1
TUBE CONNECTING 12X1/4 (SUCTIONS) ×2 IMPLANT
UNDERPAD 30X30 (UNDERPADS AND DIAPERS) ×3 IMPLANT
WATER STERILE IRR 1000ML POUR (IV SOLUTION) ×3 IMPLANT

## 2017-10-15 NOTE — Anesthesia Preprocedure Evaluation (Addendum)
Anesthesia Evaluation  Patient identified by MRN, date of birth, ID band Patient awake    Reviewed: Allergy & Precautions, NPO status , Patient's Chart, lab work & pertinent test results  Airway Mallampati: II  TM Distance: >3 FB Neck ROM: Full    Dental  (+) Teeth Intact, Dental Advisory Given, Poor Dentition, Loose,    Pulmonary    breath sounds clear to auscultation       Cardiovascular  Rhythm:Regular Rate:Normal     Neuro/Psych    GI/Hepatic   Endo/Other    Renal/GU      Musculoskeletal   Abdominal   Peds  Hematology   Anesthesia Other Findings   Reproductive/Obstetrics                            Anesthesia Physical Anesthesia Plan  ASA: III  Anesthesia Plan: General and Regional   Post-op Pain Management:    Induction: Intravenous  PONV Risk Score and Plan: Ondansetron and Dexamethasone  Airway Management Planned: LMA  Additional Equipment:   Intra-op Plan:   Post-operative Plan:   Informed Consent: I have reviewed the patients History and Physical, chart, labs and discussed the procedure including the risks, benefits and alternatives for the proposed anesthesia with the patient or authorized representative who has indicated his/her understanding and acceptance.   Dental advisory given  Plan Discussed with: CRNA and Anesthesiologist  Anesthesia Plan Comments:         Anesthesia Quick Evaluation

## 2017-10-15 NOTE — Anesthesia Procedure Notes (Signed)
Procedure Name: Intubation Date/Time: 10/15/2017 11:59 AM Performed by: Leonor Liv, CRNA Pre-anesthesia Checklist: Patient identified, Emergency Drugs available, Suction available and Patient being monitored Patient Re-evaluated:Patient Re-evaluated prior to induction Oxygen Delivery Method: Circle System Utilized Preoxygenation: Pre-oxygenation with 100% oxygen Induction Type: IV induction Ventilation: Mask ventilation without difficulty Laryngoscope Size: Mac and 3 Grade View: Grade I Tube type: Oral Tube size: 7.5 mm Number of attempts: 1 Airway Equipment and Method: Stylet and Oral airway Placement Confirmation: ETT inserted through vocal cords under direct vision,  positive ETCO2 and breath sounds checked- equal and bilateral Secured at: 23 cm Tube secured with: Tape Dental Injury: Teeth and Oropharynx as per pre-operative assessment  Comments: Extremely loose upper front teeth noted on induction. Teeth manually removed after induction by Dr. Linna Caprice with small amount of bleeding noted.

## 2017-10-15 NOTE — Anesthesia Procedure Notes (Signed)
Anesthesia Regional Block: Adductor canal block   Pre-Anesthetic Checklist: ,, timeout performed, Correct Patient, Correct Site, Correct Laterality, Correct Procedure, Correct Position, site marked, Risks and benefits discussed, pre-op evaluation,  At surgeon's request and post-op pain management  Laterality: Left  Prep: Maximum Sterile Barrier Precautions used, chloraprep       Needles:  Injection technique: Single-shot  Needle Type: Echogenic Stimulator Needle     Needle Length: 9cm  Needle Gauge: 21     Additional Needles:   Procedures:,,,, ultrasound used (permanent image in chart),,,,  Narrative:  Start time: 10/15/2017 10:00 AM End time: 10/15/2017 10:05 AM Injection made incrementally with aspirations every 5 mL.  Performed by: Personally  Anesthesiologist: Kipp BroodJoslin, Sharissa Brierley, MD  Additional Notes: 20 cc 0.75% Naropin injected easily

## 2017-10-15 NOTE — Anesthesia Postprocedure Evaluation (Signed)
Anesthesia Post Note  Patient: [name]  Procedure(s) Performed: OPEN REDUCTION INTERNAL FIXATION (ORIF) ANKLE FRACTURE (Left Ankle)     Patient location during evaluation: PACU Anesthesia Type: Regional Level of consciousness: awake and alert Pain management: pain level controlled Vital Signs Assessment: post-procedure vital signs reviewed and stable Respiratory status: spontaneous breathing, nonlabored ventilation, respiratory function stable and patient connected to nasal cannula oxygen Cardiovascular status: blood pressure returned to baseline and stable Postop Assessment: no apparent nausea or vomiting Anesthetic complications: no Comments: I discussed loss of teeth with patient and that we felt it was necessary to remove them. Patient understands and agreed that the teeth needed to come out.  Kipp Broodavid Prakash Kimberling    Last Vitals:  Vitals:   10/15/17 1322 10/15/17 1337  BP: 114/77 116/82  Pulse: (!) 101 92  Resp: (!) 26 20  Temp:    SpO2: 90% 96%    Last Pain:  Vitals:   10/15/17 1340  TempSrc:   PainSc: Asleep                 Newel Oien COKER
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
## 2017-10-15 NOTE — Anesthesia Procedure Notes (Signed)
Anesthesia Regional Block: Popliteal block   Pre-Anesthetic Checklist: ,, timeout performed, Correct Patient, Correct Site, Correct Laterality, Correct Procedure, Correct Position, site marked, Risks and benefits discussed,  Surgical consent,  Pre-op evaluation,  At surgeon's request and post-op pain management  Laterality: Left  Prep: chloraprep       Needles:  Injection technique: Single-shot  Needle Type: Stimulator Needle - 40      Needle Gauge: 22     Additional Needles:   Procedures:, nerve stimulator,,,,,,,  Narrative:  Start time: 10/15/2017 9:55 AM End time: 10/15/2017 10:00 AM Injection made incrementally with aspirations every 5 mL.  Performed by: Personally  Anesthesiologist: Kipp BroodJoslin, Tesneem Dufrane, MD  Additional Notes: 25 cc 0.5% Bupivacaine with 1:200 epi injected easily

## 2017-10-15 NOTE — Transfer of Care (Signed)
Immediate Anesthesia Transfer of Care Note  Patient: [name]  Procedure(s) Performed: OPEN REDUCTION INTERNAL FIXATION (ORIF) ANKLE FRACTURE (Left Ankle)  Patient Location: PACU  Anesthesia Type:General  Level of Consciousness: awake, patient cooperative and responds to stimulation  Airway & Oxygen Therapy: Patient Spontanous Breathing and Patient connected to face mask oxygen  Post-op Assessment: Report given to RN, Post -op Vital signs reviewed and stable and Patient moving all extremities X 4  Post vital signs: Reviewed and stable  Last Vitals:  Vitals:   10/14/17 2300 10/15/17 0028  BP: 110/88 127/80  Pulse: (!) 111 98  Resp: 15 15  Temp:  37.4 C  SpO2: 98% 100%    Last Pain:  Vitals:   10/15/17 0300  TempSrc:   PainSc: 5          Complications: No apparent anesthesia complications

## 2017-10-15 NOTE — Progress Notes (Signed)
Anesthesiology Note:  62 year old male scheduled to undergo ORIF R. Ankle fracture. Patient has very poor dentition and two very loose upper front teeth. Both freely mobile and with high likelihood that direct laryngoscopy would dislodge them. Decision made to remove both teeth after induction, prior to intubation. Both teeth removed with minimal effort. Teeth saved. Patient then intubated without difficulty. Will discuss with patient after surgery.    Kipp Broodavid Colbie Sliker

## 2017-10-16 DIAGNOSIS — S82852A Displaced trimalleolar fracture of left lower leg, initial encounter for closed fracture: Secondary | ICD-10-CM | POA: Diagnosis present

## 2017-10-16 DIAGNOSIS — Z590 Homelessness: Secondary | ICD-10-CM | POA: Diagnosis not present

## 2017-10-16 DIAGNOSIS — F1410 Cocaine abuse, uncomplicated: Secondary | ICD-10-CM | POA: Diagnosis present

## 2017-10-16 DIAGNOSIS — M25572 Pain in left ankle and joints of left foot: Secondary | ICD-10-CM | POA: Diagnosis present

## 2017-10-16 DIAGNOSIS — Y9289 Other specified places as the place of occurrence of the external cause: Secondary | ICD-10-CM | POA: Diagnosis not present

## 2017-10-16 DIAGNOSIS — F10129 Alcohol abuse with intoxication, unspecified: Secondary | ICD-10-CM | POA: Diagnosis present
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
## 2017-10-16 NOTE — Progress Notes (Signed)
Subjective: 1 Day Post-Op Procedure(s) (LRB): OPEN REDUCTION INTERNAL FIXATION (ORIF) ANKLE FRACTURE (Left) Patient reports pain as 5 on 0-10 scale.    Objective: Vital signs in last 24 hours: Temp:  [98.7 F (37.1 C)-99.7 F (37.6 C)] 99.6 F (37.6 C) (03/09 0533) Pulse Rate:  [92-108] 107 (03/09 0533) Resp:  [17-26] 18 (03/09 0533) BP: (109-128)/(77-84) 128/84 (03/09 0533) SpO2:  [90 %-99 %] 95 % (03/09 0533)  Intake/Output from previous day: 03/08 0701 - 03/09 0700 In: 2214.2 [P.O.:480; I.V.:1534.2; IV Piggyback:200] Out: 2075 [Urine:2050; Blood:25] Intake/Output this shift: No intake/output data recorded.  Recent Labs    10/14/17 1922  HGB 14.9   Recent Labs    10/14/17 1922  WBC 6.3  RBC 4.53  HCT 42.4  PLT 255   Recent Labs    10/14/17 1922  NA 142  K 3.8  CL 105  CO2 24  BUN 9  CREATININE 1.03  GLUCOSE 97  CALCIUM 9.3   Recent Labs    10/14/17 1922  INR 0.99    ABD soft Neurovascular intact Sensation intact distally Intact pulses distally Incision: dressing C/D/I  Assessment/Plan: 1 Day Post-Op Procedure(s) (LRB): OPEN REDUCTION INTERNAL FIXATION (ORIF) ANKLE FRACTURE (Left)  Active Problems:   Closed right ankle fracture, initial encounter  Advance diet Up with therapy Non weight bearing on left lower extremity.  Please get up to the chair twice a day.  Patient doing well today  Pascal LuxSHEPPERSON,Neftali Thurow J 10/16/2017, 7:44 AM

## 2017-10-17 MED ORDER — CEFAZOLIN SODIUM-DEXTROSE 2-4 GM/100ML-% IV SOLN
2.0000 g | INTRAVENOUS | Status: AC
  Administered 2017-10-18: 2 g via INTRAVENOUS
  Filled 2017-10-17 (×2): qty 100

## 2017-10-17 NOTE — Anesthesia Preprocedure Evaluation (Addendum)
Anesthesia Evaluation  Patient identified by MRN, date of birth, ID band Patient awake    Reviewed: Allergy & Precautions, NPO status , Patient's Chart, lab work & pertinent test results  Airway Mallampati: II  TM Distance: >3 FB Neck ROM: Full    Dental  (+) Teeth Intact, Dental Advisory Given, Poor Dentition, Loose,    Pulmonary neg pulmonary ROS,    Pulmonary exam normal breath sounds clear to auscultation       Cardiovascular negative cardio ROS Normal cardiovascular exam Rhythm:Regular Rate:Normal     Neuro/Psych    GI/Hepatic   Endo/Other    Renal/GU      Musculoskeletal   Abdominal   Peds  Hematology   Anesthesia Other Findings   Reproductive/Obstetrics                           Lab Results  Component Value Date   CREATININE 1.03 10/14/2017   BUN 9 10/14/2017   NA 142 10/14/2017   K 3.8 10/14/2017   CL 105 10/14/2017   CO2 24 10/14/2017    Lab Results  Component Value Date   WBC 6.3 10/14/2017   HGB 14.9 10/14/2017   HCT 42.4 10/14/2017   MCV 93.6 10/14/2017   PLT 255 10/14/2017    Anesthesia Physical Anesthesia Plan  ASA: II  Anesthesia Plan: General and Regional   Post-op Pain Management:    Induction:   PONV Risk Score and Plan: Treatment may vary due to age or medical condition, Dexamethasone and Ondansetron  Airway Management Planned: LMA  Additional Equipment:   Intra-op Plan:   Post-operative Plan:   Informed Consent: I have reviewed the patients History and Physical, chart, labs and discussed the procedure including the risks, benefits and alternatives for the proposed anesthesia with the patient or authorized representative who has indicated his/her understanding and acceptance.   Dental advisory given  Plan Discussed with: CRNA  Anesthesia Plan Comments:         Anesthesia Quick Evaluation

## 2017-10-17 NOTE — Progress Notes (Signed)
Subjective: 2 Days Post-Op Procedure(s) (LRB): OPEN REDUCTION INTERNAL FIXATION (ORIF) ANKLE FRACTURE (Left) Patient reports pain as 4 on 0-10 scale.    Objective: Vital signs in last 24 hours: Temp:  [98.6 F (37 C)-100.1 F (37.8 C)] 100.1 F (37.8 C) (03/09 2207) Pulse Rate:  [98-102] 98 (03/09 2207) Resp:  [16-18] 16 (03/09 2207) BP: (122-128)/(81-86) 122/81 (03/09 2207) SpO2:  [92 %-98 %] 92 % (03/09 2207)  Intake/Output from previous day: 03/09 0701 - 03/10 0700 In: 720 [P.O.:720] Out: 1000 [Urine:1000] Intake/Output this shift: No intake/output data recorded.  Recent Labs    10/14/17 1922  HGB 14.9   Recent Labs    10/14/17 1922  WBC 6.3  RBC 4.53  HCT 42.4  PLT 255   Recent Labs    10/14/17 1922  NA 142  K 3.8  CL 105  CO2 24  BUN 9  CREATININE 1.03  GLUCOSE 97  CALCIUM 9.3   Recent Labs    10/14/17 1922  INR 0.99    ABD soft Neurovascular intact Sensation intact distally Intact pulses distally Incision: dressing C/D/I  Assessment/Plan: 2 Days Post-Op Procedure(s) (LRB): OPEN REDUCTION INTERNAL FIXATION (ORIF) ANKLE FRACTURE (Left) Up with therapy Patient looking much better this am.  He easily got out of bed and ambulated non weight bearing to the chair using a walker with me.  He is much more alert and interactive today.  Tachycardia has resolved.  Patient had a low grade fever (100.1) this am.  When I worked with him his temp was 98.9.  I have him using an incentive spirometer 10 times an hour while awake.  Plan for surgery tomorrow by Dr Jena GaussHaddix.    [name] J 10/17/2017, 10:25 AM

## 2017-10-18 ENCOUNTER — Inpatient Hospital Stay (HOSPITAL_COMMUNITY): Payer: Medicaid Other

## 2017-10-18 ENCOUNTER — Inpatient Hospital Stay (HOSPITAL_COMMUNITY): Payer: Medicaid Other | Admitting: Certified Registered Nurse Anesthetist

## 2017-10-18 ENCOUNTER — Encounter (HOSPITAL_COMMUNITY): Payer: Self-pay | Admitting: Student

## 2017-10-18 ENCOUNTER — Encounter (HOSPITAL_COMMUNITY)
Admission: EM | Disposition: A | Discharge status: Home / Self Care | Payer: Self-pay | Source: Home / Self Care | Attending: Student

## 2017-10-18 HISTORY — PX: ORIF ANKLE FRACTURE: SHX5408

## 2017-10-18 SURGERY — OPEN REDUCTION INTERNAL FIXATION (ORIF) ANKLE FRACTURE
Anesthesia: General | Site: Ankle | Laterality: Left

## 2017-10-18 MED ORDER — VANCOMYCIN HCL 1000 MG IV SOLR
INTRAVENOUS | Status: DC | PRN
  Administered 2017-10-18: 1000 mg

## 2017-10-18 MED ORDER — ROPIVACAINE HCL 5 MG/ML IJ SOLN
INTRAMUSCULAR | Status: DC | PRN
  Administered 2017-10-18: 30 mL via PERINEURAL

## 2017-10-18 MED ORDER — MIDAZOLAM HCL 2 MG/2ML IJ SOLN
INTRAMUSCULAR | Status: DC | PRN
  Administered 2017-10-18: 2 mg via INTRAVENOUS

## 2017-10-18 MED ORDER — FENTANYL CITRATE (PF) 250 MCG/5ML IJ SOLN
INTRAMUSCULAR | Status: AC
  Filled 2017-10-18: qty 5

## 2017-10-18 MED ORDER — MIDAZOLAM HCL 2 MG/2ML IJ SOLN
INTRAMUSCULAR | Status: AC
  Filled 2017-10-18: qty 2

## 2017-10-18 MED ORDER — METHOCARBAMOL 750 MG PO TABS: 750.0000 mg | ORAL_TABLET | Freq: Four times a day (QID) | ORAL | 0 refills | Status: DC | PRN

## 2017-10-18 MED ORDER — BACITRACIN ZINC 500 UNIT/GM EX OINT
TOPICAL_OINTMENT | CUTANEOUS | Status: AC
  Filled 2017-10-18: qty 28.35

## 2017-10-18 MED ORDER — PHENYLEPHRINE 40 MCG/ML (10ML) SYRINGE FOR IV PUSH (FOR BLOOD PRESSURE SUPPORT)
PREFILLED_SYRINGE | INTRAVENOUS | Status: DC | PRN
  Administered 2017-10-18: 120 ug via INTRAVENOUS
  Administered 2017-10-18 (×2): 80 ug via INTRAVENOUS
  Administered 2017-10-18: 120 ug via INTRAVENOUS

## 2017-10-18 MED ORDER — ASPIRIN EC 325 MG PO TBEC: 325.0000 mg | DELAYED_RELEASE_TABLET | Freq: Every day | ORAL | 0 refills | Status: AC

## 2017-10-18 MED ORDER — FENTANYL CITRATE (PF) 100 MCG/2ML IJ SOLN
INTRAMUSCULAR | Status: DC | PRN
  Administered 2017-10-18 (×3): 50 ug via INTRAVENOUS

## 2017-10-18 MED ORDER — PHENYLEPHRINE 40 MCG/ML (10ML) SYRINGE FOR IV PUSH (FOR BLOOD PRESSURE SUPPORT)
PREFILLED_SYRINGE | INTRAVENOUS | Status: AC
  Filled 2017-10-18: qty 10

## 2017-10-18 MED ORDER — GABAPENTIN 300 MG PO CAPS
300.0000 mg | ORAL_CAPSULE | Freq: Once | ORAL | Status: AC
  Administered 2017-10-18: 300 mg via ORAL
  Filled 2017-10-18: qty 1

## 2017-10-18 MED ORDER — ONDANSETRON HCL 4 MG/2ML IJ SOLN
INTRAMUSCULAR | Status: AC
  Filled 2017-10-18: qty 2

## 2017-10-18 MED ORDER — OXYCODONE-ACETAMINOPHEN 5-325 MG PO TABS: 1.0000 | ORAL_TABLET | Freq: Four times a day (QID) | ORAL | 0 refills | Status: DC | PRN

## 2017-10-18 MED ORDER — ONDANSETRON HCL 4 MG/2ML IJ SOLN
INTRAMUSCULAR | Status: DC | PRN
  Administered 2017-10-18: 4 mg via INTRAVENOUS

## 2017-10-18 MED ORDER — VANCOMYCIN HCL 1000 MG IV SOLR
INTRAVENOUS | Status: AC
  Filled 2017-10-18: qty 1000

## 2017-10-18 MED ORDER — DEXMEDETOMIDINE HCL IN NACL 200 MCG/50ML IV SOLN
INTRAVENOUS | Status: AC
  Filled 2017-10-18: qty 50

## 2017-10-18 MED ORDER — DEXMEDETOMIDINE HCL 200 MCG/2ML IV SOLN
INTRAVENOUS | Status: DC | PRN
  Administered 2017-10-18: 20 ug via INTRAVENOUS

## 2017-10-18 MED ORDER — 0.9 % SODIUM CHLORIDE (POUR BTL) OPTIME
TOPICAL | Status: DC | PRN
  Administered 2017-10-18: 1000 mL

## 2017-10-18 MED ORDER — MEPERIDINE HCL 50 MG/ML IJ SOLN: 6.2500 mg | INTRAMUSCULAR | Status: DC | PRN

## 2017-10-18 MED ORDER — PROPOFOL 10 MG/ML IV BOLUS
INTRAVENOUS | Status: DC | PRN
  Administered 2017-10-18: 200 mg via INTRAVENOUS

## 2017-10-18 MED ORDER — LACTATED RINGERS IV SOLN
INTRAVENOUS | Status: DC | PRN
  Administered 2017-10-18 (×2): via INTRAVENOUS

## 2017-10-18 MED ORDER — PROPOFOL 10 MG/ML IV BOLUS
INTRAVENOUS | Status: AC
  Filled 2017-10-18: qty 20

## 2017-10-18 MED ORDER — BACITRACIN ZINC 500 UNIT/GM EX OINT
TOPICAL_OINTMENT | CUTANEOUS | Status: DC | PRN
  Administered 2017-10-18: 1 via TOPICAL

## 2017-10-18 MED ORDER — ACETAMINOPHEN 10 MG/ML IV SOLN: 1000.0000 mg | Freq: Once | INTRAVENOUS | Status: DC | PRN

## 2017-10-18 MED ORDER — ACETAMINOPHEN 500 MG PO TABS
1000.0000 mg | ORAL_TABLET | Freq: Once | ORAL | Status: AC
  Administered 2017-10-18: 1000 mg via ORAL
  Filled 2017-10-18: qty 2

## 2017-10-18 MED ORDER — HYDROCODONE-ACETAMINOPHEN 7.5-325 MG PO TABS: 1.0000 | ORAL_TABLET | Freq: Once | ORAL | Status: DC | PRN

## 2017-10-18 MED ORDER — PROMETHAZINE HCL 25 MG/ML IJ SOLN: 6.2500 mg | INTRAMUSCULAR | Status: DC | PRN

## 2017-10-18 MED ORDER — HYDROMORPHONE HCL 1 MG/ML IJ SOLN: 0.2500 mg | INTRAMUSCULAR | Status: DC | PRN

## 2017-10-18 SURGICAL SUPPLY — 67 items
BANDAGE ACE 4X5 VEL STRL LF (GAUZE/BANDAGES/DRESSINGS) ×2 IMPLANT
BANDAGE ACE 6X5 VEL STRL LF (GAUZE/BANDAGES/DRESSINGS) ×2 IMPLANT
BANDAGE ESMARK 6X9 LF (GAUZE/BANDAGES/DRESSINGS) ×1 IMPLANT
BIT DRILL 2.5X110 QC LCP DISP (BIT) ×2 IMPLANT
BIT DRILL CALIBRATED 1.8MM (BIT) IMPLANT
BIT DRILL QC 3.5X110 (BIT) ×2 IMPLANT
BNDG CMPR 9X6 STRL LF SNTH (GAUZE/BANDAGES/DRESSINGS) ×1
BNDG COHESIVE 4X5 TAN STRL (GAUZE/BANDAGES/DRESSINGS) ×3 IMPLANT
BNDG ESMARK 6X9 LF (GAUZE/BANDAGES/DRESSINGS) ×3
BOOT STEPPER DURA MED (SOFTGOODS) ×2 IMPLANT
BRUSH SCRUB SURG 4.25 DISP (MISCELLANEOUS) ×4 IMPLANT
CHLORAPREP W/TINT 26ML (MISCELLANEOUS) ×3 IMPLANT
COVER MAYO STAND STRL (DRAPES) ×3 IMPLANT
COVER SURGICAL LIGHT HANDLE (MISCELLANEOUS) ×3 IMPLANT
DRAPE C-ARM 42X72 X-RAY (DRAPES) ×3 IMPLANT
DRAPE C-ARMOR (DRAPES) ×3 IMPLANT
DRAPE HALF SHEET 40X57 (DRAPES) ×4 IMPLANT
DRAPE ORTHO SPLIT 77X108 STRL (DRAPES) ×6
DRAPE SURG ORHT 6 SPLT 77X108 (DRAPES) ×2 IMPLANT
DRAPE U-SHAPE 47X51 STRL (DRAPES) ×3 IMPLANT
DRILL CALIBRATED 1.8MM (BIT) ×3
DRSG ADAPTIC 3X8 NADH LF (GAUZE/BANDAGES/DRESSINGS) ×2 IMPLANT
DRSG EMULSION OIL 3X3 NADH (GAUZE/BANDAGES/DRESSINGS) IMPLANT
ELECT REM PT RETURN 9FT ADLT (ELECTROSURGICAL) ×3
ELECTRODE REM PT RTRN 9FT ADLT (ELECTROSURGICAL) ×1 IMPLANT
GAUZE SPONGE 4X4 12PLY STRL (GAUZE/BANDAGES/DRESSINGS) ×2 IMPLANT
GLOVE BIO SURGEON STRL SZ7.5 (GLOVE) ×12 IMPLANT
GLOVE BIOGEL PI IND STRL 7.5 (GLOVE) ×1 IMPLANT
GLOVE BIOGEL PI INDICATOR 7.5 (GLOVE) ×2
GOWN STRL REUS W/ TWL LRG LVL3 (GOWN DISPOSABLE) ×2 IMPLANT
GOWN STRL REUS W/TWL LRG LVL3 (GOWN DISPOSABLE) ×6
KIT ROOM TURNOVER OR (KITS) ×3 IMPLANT
MANIFOLD NEPTUNE II (INSTRUMENTS) ×1 IMPLANT
NDL HYPO 21X1.5 SAFETY (NEEDLE) IMPLANT
NEEDLE HYPO 21X1.5 SAFETY (NEEDLE) IMPLANT
NS IRRIG 1000ML POUR BTL (IV SOLUTION) ×3 IMPLANT
PACK TOTAL JOINT (CUSTOM PROCEDURE TRAY) ×3 IMPLANT
PAD ARMBOARD 7.5X6 YLW CONV (MISCELLANEOUS) ×4 IMPLANT
PAD CAST 4YDX4 CTTN HI CHSV (CAST SUPPLIES) IMPLANT
PADDING CAST COTTON 4X4 STRL (CAST SUPPLIES) ×6
PADDING CAST COTTON 6X4 STRL (CAST SUPPLIES) ×2 IMPLANT
PLATE LCP 3.5 1/3 TUB 6HX69 (Plate) ×2 IMPLANT
SCREW CORTEX 3.5 12MM (Screw) ×4 IMPLANT
SCREW CORTEX 3.5 14MM (Screw) ×2 IMPLANT
SCREW CORTEX 3.5 16MM (Screw) ×2 IMPLANT
SCREW CORTEX 3.5 18MM (Screw) ×2 IMPLANT
SCREW CORTEX SLFTPNG 34MM 2.4 (Screw) ×2 IMPLANT
SCREW LOCK CORT ST 3.5X12 (Screw) IMPLANT
SCREW LOCK CORT ST 3.5X14 (Screw) IMPLANT
SCREW LOCK CORT ST 3.5X16 (Screw) IMPLANT
SCREW LOCK CORT ST 3.5X18 (Screw) IMPLANT
SCREW LOCK T15 FT 14X3.5X2.9X (Screw) IMPLANT
SCREW LOCKING 3.5X14 (Screw) ×3 IMPLANT
SPONGE LAP 18X18 X RAY DECT (DISPOSABLE) ×1 IMPLANT
STAPLER VISISTAT 35W (STAPLE) ×1 IMPLANT
SUCTION FRAZIER HANDLE 10FR (MISCELLANEOUS) ×2
SUCTION TUBE FRAZIER 10FR DISP (MISCELLANEOUS) ×1 IMPLANT
SUT ETHILON 3 0 PS 1 (SUTURE) ×6 IMPLANT
SUT PROLENE 0 CT (SUTURE) IMPLANT
SUT VIC AB 2-0 CT1 27 (SUTURE) ×6
SUT VIC AB 2-0 CT1 TAPERPNT 27 (SUTURE) ×2 IMPLANT
TOWEL OR 17X24 6PK STRL BLUE (TOWEL DISPOSABLE) ×1 IMPLANT
TOWEL OR 17X26 10 PK STRL BLUE (TOWEL DISPOSABLE) ×6 IMPLANT
TUBE CONNECTING 12'X1/4 (SUCTIONS)
TUBE CONNECTING 12X1/4 (SUCTIONS) ×1 IMPLANT
UNDERPAD 30X30 (UNDERPADS AND DIAPERS) ×3 IMPLANT
WATER STERILE IRR 1000ML POUR (IV SOLUTION) ×1 IMPLANT

## 2017-10-18 NOTE — Anesthesia Procedure Notes (Signed)
Procedure Name: LMA Insertion Date/Time: 10/18/2017 7:38 AM Performed by: Jodell Ciproato, Vanessa Alesi A, CRNA Pre-anesthesia Checklist: Patient identified, Emergency Drugs available, Suction available and Patient being monitored Patient Re-evaluated:Patient Re-evaluated prior to induction Oxygen Delivery Method: Circle System Utilized Preoxygenation: Pre-oxygenation with 100% oxygen Induction Type: IV induction Ventilation: Mask ventilation without difficulty LMA: LMA inserted LMA Size: 5.0 Number of attempts: 1 Airway Equipment and Method: Bite block Placement Confirmation: positive ETCO2 Tube secured with: Tape Dental Injury: Teeth and Oropharynx as per pre-operative assessment
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
## 2017-10-18 NOTE — Anesthesia Postprocedure Evaluation (Signed)
Anesthesia Post Note  Patient: [name]  Procedure(s) Performed: OPEN REDUCTION INTERNAL FIXATION (ORIF) ANKLE FRACTURE (Left Ankle)     Patient location during evaluation: PACU Anesthesia Type: Regional and General Level of consciousness: awake and alert Pain management: pain level controlled Vital Signs Assessment: post-procedure vital signs reviewed and stable Respiratory status: spontaneous breathing, nonlabored ventilation, respiratory function stable and patient connected to nasal cannula oxygen Cardiovascular status: blood pressure returned to baseline and stable Postop Assessment: no apparent nausea or vomiting Anesthetic complications: no    Last Vitals:  Vitals:   10/18/17 1023 10/18/17 1028  BP: 112/79   Pulse: 74   Resp: 17   Temp:  36.4 C  SpO2: 100%     Last Pain:  Vitals:   10/18/17 0922  TempSrc:   PainSc: Asleep                 Cecile HearingStephen Edward Turk
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
## 2017-10-18 NOTE — Progress Notes (Signed)
Orthopedic Tech Progress Note Patient Details:  [name] 01-22-1956 [id number]  Ortho Devices Type of Ortho Device: CAM walker Ortho Device/Splint Interventions: Ordered    as ordered by Dr. Cheri GuppyHaddix   Levert Heslop 10/18/2017, 9:43 AM

## 2017-10-18 NOTE — Discharge Instructions (Signed)
[facility] Discharge Instructions   General Discharge Instructions  WEIGHT BEARING STATUS: Nonweight bearing to left leg  RANGE OF MOTION/ACTIVITY: You may come out of the boot three times a day to move your ankle. You must sleep in your boot.  Wound Care: See below  DVT/PE prophylaxis: Aspirin 325mg  daily for 1 month  Diet: as you were eating previously.  Can use over the counter stool softeners and bowel preparations, such as Miralax, to help with bowel movements.  Narcotics can be constipating.  Be sure to drink plenty of fluids  PAIN MEDICATION USE AND EXPECTATIONS  You have likely been given narcotic medications to help control your pain.  After a traumatic event that results in an fracture (broken bone) with or without surgery, it is ok to use narcotic pain medications to help control one's pain.  We understand that everyone responds to pain differently and each individual patient will be evaluated on a regular basis for the continued need for narcotic medications. Ideally, narcotic medication use should last no more than 6-8 weeks (coinciding with fracture healing).   As a patient it is your responsibility as well to monitor narcotic medication use and report the amount and frequency you use these medications when you come to your office visit.   We would also advise that if you are using narcotic medications, you should take a dose prior to therapy to maximize you participation.  IF YOU ARE ON NARCOTIC MEDICATIONS IT IS NOT PERMISSIBLE TO OPERATE A MOTOR VEHICLE (MOTORCYCLE/CAR/TRUCK/MOPED) OR HEAVY MACHINERY DO NOT MIX NARCOTICS WITH OTHER CNS (CENTRAL NERVOUS SYSTEM) DEPRESSANTS SUCH AS ALCOHOL   STOP SMOKING OR USING NICOTINE PRODUCTS!!!!  As discussed nicotine severely impairs your body's ability to heal surgical and traumatic wounds but also impairs bone healing.  Wounds and bone heal by forming microscopic blood vessels (angiogenesis) and nicotine is a  vasoconstrictor (essentially, shrinks blood vessels).  Therefore, if vasoconstriction occurs to these microscopic blood vessels they essentially disappear and are unable to deliver necessary nutrients to the healing tissue.  This is one modifiable factor that you can do to dramatically increase your chances of healing your injury.    (This means no smoking, no nicotine gum, patches, etc)  DO NOT USE NONSTEROIDAL ANTI-INFLAMMATORY DRUGS (NSAID'S)  Using products such as Advil (ibuprofen), Aleve (naproxen), Motrin (ibuprofen) for additional pain control during fracture healing can delay and/or prevent the healing response.  If you would like to take over the counter (OTC) medication, Tylenol (acetaminophen) is ok.  However, some narcotic medications that are given for pain control contain acetaminophen as well. Therefore, you should not exceed more than 4000 mg of tylenol in a day if you do not have liver disease.  Also note that there are may OTC medicines, such as cold medicines and allergy medicines that my contain tylenol as well.  If you have any questions about medications and/or interactions please ask your doctor/PA or your pharmacist.      ICE AND ELEVATE INJURED/OPERATIVE EXTREMITY  Using ice and elevating the injured extremity above your heart can help with swelling and pain control.  Icing in a pulsatile fashion, such as 20 minutes on and 20 minutes off, can be followed.    Do not place ice directly on skin. Make sure there is a barrier between to skin and the ice pack.    Using frozen items such as frozen peas works well as the conform nicely to the are that needs to be iced.  USE AN ACE WRAP OR TED HOSE FOR SWELLING CONTROL  In addition to icing and elevation, Ace wraps or TED hose are used to help limit and resolve swelling.  It is recommended to use Ace wraps or TED hose until you are informed to stop.    When using Ace Wraps start the wrapping distally (farthest away from the body) and  wrap proximally (closer to the body)   Example: If you had surgery on your leg or thing and you do not have a splint on, start the ace wrap at the toes and work your way up to the thigh        If you had surgery on your upper extremity and do not have a splint on, start the ace wrap at your fingers and work your way up to the upper arm  IF YOU ARE IN A SPLINT OR CAST DO NOT REMOVE IT FOR ANY REASON   If your splint gets wet for any reason please contact the office immediately. You may shower in your splint or cast as long as you keep it dry.  This can be done by wrapping in a cast cover or garbage back (or similar)  Do Not stick any thing down your splint or cast such as pencils, money, or hangers to try and scratch yourself with.  If you feel itchy take benadryl as prescribed on the bottle for itching  IF YOU ARE IN A CAM BOOT (BLACK BOOT)  You may remove boot periodically. Perform daily dressing changes as noted below.  Wash the liner of the boot regularly and wear a sock when wearing the boot. It is recommended that you sleep in the boot until told otherwise  CALL THE OFFICE WITH ANY QUESTIONS OR CONCERNS: [phone]    Discharge Wound Care Instructions  Do NOT apply any ointments, solutions or lotions to pin sites or surgical wounds.  These prevent needed drainage and even though solutions like hydrogen peroxide kill bacteria, they also damage cells lining the pin sites that help fight infection.  Applying lotions or ointments can keep the wounds moist and can cause them to breakdown and open up as well. This can increase the risk for infection. When in doubt call the office.  Surgical incisions should be dressed daily.  If any drainage is noted, use one layer of adaptic, then gauze, Kerlix, and an ace wrap.  Once the incision is completely dry and without drainage, it may be left open to air out.  Showering may begin 36-48 hours later.  Cleaning gently with soap and water.  Traumatic  wounds should be dressed daily as well.    One layer of adaptic, gauze, Kerlix, then ace wrap.  The adaptic can be discontinued once the draining has ceased    If you have a wet to dry dressing: wet the gauze with saline the squeeze as much saline out so the gauze is moist (not soaking wet), place moistened gauze over wound, then place a dry gauze over the moist one, followed by Kerlix wrap, then ace wrap.

## 2017-10-18 NOTE — Anesthesia Procedure Notes (Signed)
Anesthesia Regional Block: Popliteal block   Pre-Anesthetic Checklist: ,, timeout performed, Correct Patient, Correct Site, Correct Laterality, Correct Procedure, Correct Position, site marked, Risks and benefits discussed, pre-op evaluation,  At surgeon's request and post-op pain management  Laterality: Left  Prep: Maximum Sterile Barrier Precautions used, chloraprep       Needles:  Injection technique: Single-shot  Needle Type: Echogenic Stimulator Needle     Needle Length: 9cm  Needle Gauge: 21     Additional Needles:   Procedures:, nerve stimulator,,, ultrasound used (permanent image in chart),,,,   Nerve Stimulator or Paresthesia:  Response: Peroneal,  Response: Tibial,   Additional Responses:   Narrative:  Start time: 10/18/2017 7:10 AM End time: 10/18/2017 7:16 AM Injection made incrementally with aspirations every 5 mL. Anesthesiologist: Trevor IhaHouser, Kadeshia Kasparian A, MD  Additional Notes: 2% Lidocaine skin wheel. Saphenous block with 10cc of 0.5% Bupivicaine plain.

## 2017-10-18 NOTE — Progress Notes (Signed)
Contacted on call this evening to clarify if patient is to indeed be discharged to home today 10/18/17.  On-call confirmed patient is to be discharged as per MD order and MD notes.  Patient was given Tylenol 650 mg PO for pain and low grade temp of 100.1.  Patient contacted [employer] for ride to his destination.  Two RN's accompanied patient downstairs to main entrance to taxi awaiting via wheelchair.  Patient entered safely into taxi.  He left unit alert and oriented x4; prescriptions and follow up information provided.

## 2017-10-18 NOTE — Transfer of Care (Signed)
Immediate Anesthesia Transfer of Care Note  Patient: [name]  Procedure(s) Performed: OPEN REDUCTION INTERNAL FIXATION (ORIF) ANKLE FRACTURE (Left Ankle)  Patient Location: PACU  Anesthesia Type:General  Level of Consciousness: drowsy  Airway & Oxygen Therapy: Patient Spontanous Breathing and Patient connected to nasal cannula oxygen  Post-op Assessment: Report given to RN and Post -op Vital signs reviewed and stable  Post vital signs: Reviewed and stable  Last Vitals:  Vitals:   10/18/17 0532 10/18/17 0722  BP: (!) 121/96   Pulse: 84 88  Resp: 16 18  Temp: 37.2 C   SpO2: 97% 98%    Last Pain:  Vitals:   10/18/17 0532  TempSrc: Oral  PainSc:          Complications: No apparent anesthesia complications

## 2017-10-18 NOTE — Interval H&P Note (Signed)
History and Physical Interval Note:  10/18/2017 7:08 AM  [name]  has presented today for surgery, with the diagnosis of left ankle fracture dislocation  The various methods of treatment have been discussed with the patient and family. After consideration of risks, benefits and other options for treatment, the patient has consented to  Procedure(s): OPEN REDUCTION INTERNAL FIXATION (ORIF) ANKLE FRACTURE (Left) as a surgical intervention .  The patient's history has been reviewed, patient examined, no change in status, stable for surgery.  I have reviewed the patient's chart and labs.  Questions were answered to the patient's satisfaction.     Caryn BeeKevin P Nellie Pester

## 2017-10-18 NOTE — Progress Notes (Signed)
Dr Jena GaussHaddix paged in regards to clarification of patient being discharged this evening

## 2017-10-19 MED FILL — METHOCARBAMOL 750 MG TABS: 750 | 5 days supply | Qty: 20 | Fill #0

## 2017-10-20 ENCOUNTER — Encounter (HOSPITAL_COMMUNITY): Payer: Self-pay | Admitting: Student

## 2018-04-04 ENCOUNTER — Ambulatory Visit: Payer: Medicaid Other | Attending: Nurse Practitioner | Admitting: Nurse Practitioner

## 2018-04-04 ENCOUNTER — Encounter: Payer: Self-pay | Admitting: Nurse Practitioner

## 2018-04-04 VITALS — BP 136/96 | HR 80 | Temp 98.3°F | Ht 64.0 in | Wt 158.2 lb

## 2018-04-04 DIAGNOSIS — R768 Other specified abnormal immunological findings in serum: Secondary | ICD-10-CM

## 2018-04-04 DIAGNOSIS — B1920 Unspecified viral hepatitis C without hepatic coma: Secondary | ICD-10-CM | POA: Diagnosis not present

## 2018-04-04 DIAGNOSIS — Z1211 Encounter for screening for malignant neoplasm of colon: Secondary | ICD-10-CM

## 2018-04-04 DIAGNOSIS — Z8781 Personal history of (healed) traumatic fracture: Secondary | ICD-10-CM | POA: Insufficient documentation

## 2018-04-04 NOTE — Patient Instructions (Signed)
Hepatitis C Hepatitis C is a liver infection. It is caused by a germ that can spread through blood and other bodily fluids. Your doctor will use blood and liver tests to:  Check for this infection.  Decide how to treat you.  Check your health after treatment.  Follow these instructions at home:  Rest.  Do not take any medicine unless your doctor says it is okay. This includes over-the-counter medicine and birth control pills.  Do not drink alcohol.  Do not have sex until your doctor says it is okay.  Do not share toothbrushes, nail clippers, razors, or needles.  Take all medicines as told by your doctor. Contact a doctor if:  You have a fever.  Your belly (abdomen) hurts.  Your pee (urine) is dark.  Your poop (bowel movement) is the color of clay.  You have joint pain. Get help right away if:  You feel more and more tired (fatigued).  You feel more and more weak.  You do not feel like eating.  You feel sick to your stomach (nauseous) or throw up (vomit).  Your skin or the whites of your eyes turn yellow (jaundice) or turn more yellow than they were before.  You bruise or bleed easily. This information is not intended to replace advice given to you by your health care provider. Make sure you discuss any questions you have with your health care provider. Document Released: 07/09/2008 Document Revised: 01/02/2016 Document Reviewed: 11/08/2013 Elsevier Interactive Patient Education  2017 [employer] Hepatitis C Test Hepatitis C is a liver infection caused by the hepatitis C virus (HCV). Hepatitis C is usually diagnosed with two blood tests. One test checks for antibodies to the virus in your blood. Antibodies are proteins that your body makes to fight infections. If you have antibodies to HCV, it means you have been infected. It does not mean you are still infected. An HCV infection may not cause any symptoms, and you may be able to get rid of the virus without  treatment. If you have antibodies to HCV, you will need to have another test to find out if you are still infected. This test is called the HCV RNA qualitative test. It looks for genetic material from HCV in your blood. If you are diagnosed with an active HCV infection, you may also have an HCV RNA quantitative test to measure the amount of virus in your blood (viral load). Your health care provider may repeat this test in order to monitor you during treatment. You may also have an HCV genotype test to identify the kind (genotype) of virus you have. This helps your health care provider determine the best treatment for you. You may be tested if you show symptoms of HCV infection. It is important to be tested because hepatitis C can lead to serious liver damage if not treated. All HCV tests require a blood sample taken from a vein in your hand or arm. What do the results mean? It is your responsibility to obtain your test results. Ask the lab or department performing the test when and how you will get your results. Talk to your health care provider if you have any questions about your test results. Results of both the HCV antibody test and the HCV RNA test will be either positive or negative. Meaning of Negative Test Results  If your HCV antibody test is negative, it may mean that you have not been infected with HCV. However, it can take a few months  for the antibodies to build up in your blood. If it is possible you may have been infected recently, you may need to repeat the test.  If your HCV RNA qualitative test is negative, this means it is unlikely you have an active HCV infection. Meaning of Positive Test Results  If your HCV antibody test is positive, it is likely that you are infected or have been infected with HCV.  If your HCV RNA qualitative test is also positive, it confirms you have an active HCV infection. Talk with your health care provider to discuss your results, treatment options,  and if necessary, the need for more tests. Talk with your health care provider if you have any questions about your results. This information is not intended to replace advice given to you by your health care provider. Make sure you discuss any questions you have with your health care provider. Document Released: 08/29/2004 Document Revised: 04/01/2016 Document Reviewed: 10/30/2013 Elsevier Interactive Patient Education  2018 [employer]  

## 2018-04-04 NOTE — Progress Notes (Signed)
Assessment & Plan:  [name] was seen today for establish care.  Diagnoses and all orders for this visit:  Hepatitis C antibody positive in blood -     Ambulatory referral to Infectious Disease  Colon cancer screening -     Ambulatory referral to Gastroenterology    Patient has been counseled on age-appropriate routine health concerns for screening and prevention. These are reviewed and up-to-date. Referrals have been placed accordingly. Immunizations are up-to-date or declined.    Subjective:   Chief Complaint  Patient presents with  . Establish Care    Pt. is here to establish care. Pt. stated he would like to check for Hep C.    HPI [name] Maglione 62 y.o. male presents to office today to establish care.    Hepatitis C  [name] is here for Hepatitis C evaluation.  Diagnosis was made by Hep C RNA, on 07-06-2016. The reason for the test was positive hep c antibody.   His transmission risk factors include unknown. Symptomatic infections: no. Prior hepatitis C treatment: no. Patient denies family history of hepatitis or other liver disease. He was referred to ID however it appears they were unable to contact him through the correct phone number. The current number we have on file belongs to a family member. He states he will need to be contacted via letter. Will place referral. He currently denies any abdominal pain, nausea, vomiting, fatigue or joint pain.     Review of Systems  Constitutional: Negative for fever, malaise/fatigue and weight loss.  HENT: Negative.  Negative for nosebleeds.   Eyes: Negative.  Negative for blurred vision, double vision and photophobia.  Respiratory: Negative.  Negative for cough and shortness of breath.   Cardiovascular: Negative.  Negative for chest pain, palpitations and leg swelling.  Gastrointestinal: Negative.  Negative for heartburn, nausea and vomiting.  Musculoskeletal: Negative.  Negative for myalgias.  Neurological: Negative.   Negative for dizziness, focal weakness, seizures and headaches.  Psychiatric/Behavioral: Negative.  Negative for suicidal ideas.    Past Medical History:  Diagnosis Date  . History of rib fracture     Past Surgical History:  Procedure Laterality Date  . ORIF ANKLE FRACTURE Left 10/15/2017   Procedure: OPEN REDUCTION INTERNAL FIXATION (ORIF) ANKLE FRACTURE;  Surgeon: Roby LoftsHaddix, Kevin P, MD;  Location: [facility] OR;  Service: Orthopedics;  Laterality: Left;  . ORIF ANKLE FRACTURE Left 10/18/2017   Procedure: OPEN REDUCTION INTERNAL FIXATION (ORIF) ANKLE FRACTURE;  Surgeon: Roby LoftsHaddix, Kevin P, MD;  Location: [facility] OR;  Service: Orthopedics;  Laterality: Left;    Family History  Problem Relation Age of Onset  . Diabetes Neg Hx   . Hypertension Neg Hx     Social History Reviewed with no changes to be made today.   Outpatient Medications Prior to Visit  Medication Sig Dispense Refill  . methocarbamol (ROBAXIN-750) 750 MG tablet Take 1 tablet (750 mg total) by mouth every 6 (six) hours as needed for muscle spasms. (Patient not taking: Reported on 04/04/2018) 20 tablet 0  . oxyCODONE-acetaminophen (PERCOCET) 5-325 MG tablet Take 1-2 tablets by mouth every 6 (six) hours as needed for severe pain. (Patient not taking: Reported on 04/04/2018) 30 tablet 0   No facility-administered medications prior to visit.     No Known Allergies     Objective:    BP (!) 136/96 (BP Location: Left Arm, Patient Position: Sitting, Cuff Size: Normal)   Pulse 80   Temp 98.3 F (36.8 C) (Oral)   Ht  5\' 4"  (1.626 m)   Wt 158 lb 3.2 oz (71.8 kg)   SpO2 97%   BMI 27.15 kg/m  Wt Readings from Last 3 Encounters:  04/04/18 158 lb 3.2 oz (71.8 kg)  10/14/17 145 lb (65.8 kg)  07/06/16 148 lb (67.1 kg)    Physical Exam  Constitutional: He is oriented to person, place, and time. He appears well-developed and well-nourished. He is cooperative.  HENT:  Head: Normocephalic and atraumatic.  Eyes: EOM are normal.  Neck: Normal  range of motion.  Cardiovascular: Normal rate, regular rhythm and normal heart sounds. Exam reveals no gallop and no friction rub.  No murmur heard. Pulmonary/Chest: Effort normal and breath sounds normal. No tachypnea. No respiratory distress. He has no decreased breath sounds. He has no wheezes. He has no rhonchi. He has no rales. He exhibits no tenderness.  Abdominal: Bowel sounds are normal.  Musculoskeletal: Normal range of motion. He exhibits no edema.  Neurological: He is alert and oriented to person, place, and time. Coordination normal.  Skin: Skin is warm and dry.  Psychiatric: He has a normal mood and affect. His behavior is normal. Judgment and thought content normal.  Nursing note and vitals reviewed.     Patient has been counseled extensively about nutrition and exercise as well as the importance of adherence with medications and regular follow-up. The patient was given clear instructions to go to ER or return to medical center if symptoms don't improve, worsen or new problems develop. The patient verbalized understanding.   Follow-up: Return in about 2 months (around 06/04/2018) for  FASTING labs and Physical.   Claiborne Rigg, FNP-BC [facility] [place], [place] [phone]   04/04/2018, 9:11 PM

## 2018-04-13 ENCOUNTER — Ambulatory Visit (INDEPENDENT_AMBULATORY_CARE_PROVIDER_SITE_OTHER): Payer: Medicaid Other | Admitting: Internal Medicine

## 2018-04-13 ENCOUNTER — Ambulatory Visit (INDEPENDENT_AMBULATORY_CARE_PROVIDER_SITE_OTHER): Payer: Medicaid Other | Admitting: Licensed Clinical Social Worker

## 2018-04-13 ENCOUNTER — Encounter: Payer: Self-pay | Admitting: Internal Medicine

## 2018-04-13 VITALS — BP 122/78 | HR 83 | Temp 97.8°F | Ht 64.0 in | Wt 156.0 lb

## 2018-04-13 DIAGNOSIS — Z23 Encounter for immunization: Secondary | ICD-10-CM

## 2018-04-13 DIAGNOSIS — F1010 Alcohol abuse, uncomplicated: Secondary | ICD-10-CM

## 2018-04-13 DIAGNOSIS — B182 Chronic viral hepatitis C: Secondary | ICD-10-CM | POA: Diagnosis not present

## 2018-04-13 NOTE — Patient Instructions (Signed)
Date 04/13/18  Dear Mr. Vanwye, As discussed in the ID Clinic, your hepatitis C therapy will include highly effective medication(s) for treatment and will vary based on the type of hepatitis C and insurance approval.  Potential medications include:          Harvoni (sofosbuvir 90mg /ledipasvir 400mg ) tablet oral daily          OR     Epclusa (sofosbuvir 400mg /velpatasvir 100mg ) tablet oral daily          OR      Mavyret (glecaprevir 100 mg/pibrentasvir 40 mg): Take 3 tablets oral daily          OR     Zepatier (elbasvir 50 mg/grazoprevir 100 mg) oral daily, +/- ribavirin              Medications are typically for 8 or 12 weeks total ---------------------------------------------------------------- Your HCV Treatment Start Date: You will be notified by our office once the medication is approved and where you can pick it up (or if mailed)   ---------------------------------------------------------------- YOUR PHARMACY CONTACT:   [facility] [address] [place], [place] [address] Phone: [phone] Hours: Monday to Friday 7:30 am to 6:00 pm   Please always contact your pharmacy at least 3-4 business days before you run out of medications to ensure your next month's medication is ready or 1 week prior to running out if you receive it by mail.  Remember, each prescription is for 28 days. ---------------------------------------------------------------- GENERAL NOTES REGARDING YOUR HEPATITIS C MEDICATION:  Some medications have the following interactions:  - Acid reducing agents such as H2 blockers (ie. Pepcid (famotidine), Zantac (ranitidine), Tagamet (cimetidine), Axid (nizatidine) and proton pump inhibitors (ie. Prilosec (omeprazole), Protonix (pantoprazole), Nexium (esomeprazole), or Aciphex (rabeprazole)). Do not take until you have discussed with a health care provider.    -Antacids that contain magnesium and/or aluminum hydroxide (ie. Milk of Magensia, Rolaids,  Gaviscon, Maalox, Mylanta, an dArthritis Pain Formula).  -Calcium carbonate (calcium supplements or antacids such as Tums, Caltrate, Os-Cal).  -St. John's wort or any products that contain St. John's wort like some herbal supplements  Please inform the office prior to starting any of these medications.  - The common side effects associated with Harvoni include:      1. Fatigue      2. Headache      3. Nausea      4. Diarrhea      5. Insomnia  Please note that this only lists the most common side effects and is NOT a comprehensive list of the potential side effects of these medications. For more information, please review the drug information sheets that come with your medication package from the pharmacy.  ---------------------------------------------------------------- GENERAL HELPFUL HINTS ON HCV THERAPY: 1. Stay well-hydrated. 2. Notify the ID Clinic of any changes in your other over-the-counter/herbal or prescription medications. 3. If you miss a dose of your medication, take the missed dose as soon as you remember. Return to your regular time/dose schedule the next day.  4.  Do not stop taking your medications without first talking with your healthcare provider. 5.  You may take Tylenol (acetaminophen), as long as the dose is less than 2000 mg (OR no more than 4 tablets of the Tylenol Extra Strengths 500mg  tablet) in 24 hours. 6.  You will see our pharmacist-specialist within the first 2 weeks of starting your medication to monitor for any possible side effects. 7.  You will have labs once during treatment, soon  after treatment completion and one final lab 6 months after treatment completion to verify the virus is out of your system.  Thayer Headings, [facility] [address] [place], [place]  [phone]

## 2018-04-13 NOTE — BH Specialist Note (Addendum)
Integrated Behavioral Health Initial Visit  MRN: [id number] Name: [name]  Number of Integrated Behavioral Health Clinician visits:: 1/6 Session Start time: 10:58am  Session End time: 11:22am Total time: 20 minutes  Type of Service: Integrated Behavioral Health- Individual/Family Interpretor:No. Interpretor Name and Language: n/a   Warm Hand Off Completed.       SUBJECTIVE: [name] is a 62 y.o. male accompanied by self Patient was referred by Dr. Luciana Axe  for substance abuse concerns. Patient reports the following symptoms/concerns: drinks about 4 cans of beer daily, more on weekends; smokes crack about once a month, unsure how much Duration of problem: unknown; Severity of problem: moderate  OBJECTIVE: Mood: Euthymic and Affect: Constricted Risk of harm to self or others: No plan to harm self or others  LIFE CONTEXT: Patient is currently homeless, has been staying with cousin sometimes but they have arguments and he leaves, reports he is now receiving Medicaid and Disability Income so is hoping to find a place to stay. He is seeking treatment for Hep C, but is currently drinking daily and smoking crack cocaine about once a month.Patient reports that his only supports are his cousin and [facility].   GOALS ADDRESSED: Patient will: 1. Reduce symptoms of: substance abuse   INTERVENTIONS: Interventions utilized: Motivational Interviewing, Supportive Counseling and Psychoeducation and/or Health Education    ASSESSMENT: Patient currently drinking 4 cans of [employer] per weekday, a few more each day  on weekends, and smokes crack cocaine about once a month. The most consistent diagnosis at this time is Alcohol Abuse.  He states that he does not think it is a problem in terms of being an alcoholic, but that he knows he drinks too much and now that it is impacting his ability to receive treatment for Hep C, he needs to stop drinking all together.  Counselor explored with  patient the possible consequences of continuing to drink, and explained why the treatment cannot be done if he is drinking. Patient initially stated that once he gets the medicine he will stop drinking. Counselor consulted with doctor, then explained to patient that he has to have stopped drinking for 3 months before he can be approved for the medicine. Patient stated that in that case he will stop drinking tomorrow. Counselor explored withdrawal symptoms with patient, and guided him to talk about times when he has stopped or tried to stop drinking before. Patient indicates that he stopped for 4 weeks and did not have any withdrawal, but this was many years ago. Counselor explored with him his motivation for stopping at that time - he reports that he was "blacking out' which he believes to be from both drinking too much and getting too hot. He has not tried to stop again since that time, but does not think he would have withdrawals now either and does not think he would need detox services. Counselor explained the concepts of tolerance and progression of disease. Counselor educated patient on withdrawal signs to keep alert for, and discussed with him the importance of having someone know he is detoxing so they can keep an eye on him. Counselor also emphasized the importance of calling 911 if he begins to feel these signs and symptoms.    Patient may benefit from monitored detox from alcohol, in order to begin Hepatitis C treatment in approximately 3 months  PLAN: 1. Follow up with behavioral health clinician on : 05/03/18  Angus Palms, LCSW

## 2018-04-13 NOTE — Progress Notes (Signed)
[facility]   CC: consideration for treatment for chronic hepatitis C  HPI:  +[name] is a 62 y.o. male who presents for initial evaluation and management of chronic hepatitis C.  Patient tested positive in 2017 as part of routine screening. Hepatitis C-associated risk factors present are: IV drug abuse (details: more than 10 years ago). Patient denies history of blood transfusion, renal dialysis. Patient has had other studies performed. Results: hepatitis C RNA by PCR, result: positive. Patient has not had prior treatment for Hepatitis C. Patient does not have a past history of liver disease. Patient does not have a family history of liver disease. Patient does not  have associated signs or symptoms related to liver disease.  Labs reviewed and confirm chronic hepatitis C with a positive viral load.   Records reviewed from PCP and hepatitis B core positive, no other antibodies or Ag checked.       Patient does not have documented immunity to Hepatitis A. Patient does not have documented immunity to Hepatitis B.    Review of Systems:  Constitutional: negative for fatigue and malaise Gastrointestinal: negative for nausea and diarrhea Hematologic/lymphatic: negative for lymphadenopathy Musculoskeletal: negative for myalgias and arthralgias All other systems reviewed and are negative       Past Medical History:  Diagnosis Date  . History of rib fracture     Prior to Admission medications   Not on File    No Known Allergies  Social History   Tobacco Use  . Smoking status: Never Smoker  . Smokeless tobacco: Never Used  Substance Use Topics  . Alcohol use: Yes    Comment: everyday   . Drug use: No    Family History  Problem Relation Age of Onset  . Diabetes Neg Hx   . Hypertension Neg Hx   no cirrhosis   Objective:  Constitutional: in no apparent distress and alert Vitals:   04/13/18 0952  Weight: 156 lb (70.8 kg)  Height: 5\' 4"   (1.626 m)  HENT: poor dentition Eyes: anicteric Cardiovascular: Cor RRR Respiratory: CTA B; normal respiratory effort Gastrointestinal: Bowel sounds are normal, liver is not enlarged, spleen is not enlarged Musculoskeletal: no pedal edema noted Skin: negatives: no rash; no porphyria cutanea tarda Lymphatic: no cervical lymphadenopathy   Laboratory Genotype: No results found for: HCVGENOTYPE HCV viral load:  Lab Results  Component Value Date   HCVQUANT 1,520,614 (H) 07/06/2016   Lab Results  Component Value Date   WBC 6.3 10/14/2017   HGB 14.9 10/14/2017   HCT 42.4 10/14/2017   MCV 93.6 10/14/2017   PLT 255 10/14/2017    Lab Results  Component Value Date   CREATININE 1.03 10/14/2017   BUN 9 10/14/2017   NA 142 10/14/2017   K 3.8 10/14/2017   CL 105 10/14/2017   CO2 24 10/14/2017    Lab Results  Component Value Date   ALT 27 10/14/2017   AST 29 10/14/2017   ALKPHOS 83 10/14/2017     Labs and history reviewed and show CHILD-PUGH unknown  5-6 points: Child class A 7-9 points: Child class B 10-15 points: Child class C  Lab Results  Component Value Date   INR 0.99 10/14/2017   BILITOT 0.6 10/14/2017   ALBUMIN 3.9 10/14/2017     Assessment: New Patient with Chronic Hepatitis C genotype unknown, untreated.  I discussed with the patient the lab findings that confirm chronic hepatitis C as well as the natural history and  progression of disease including about 30% of people who develop cirrhosis of the liver if left untreated and once cirrhosis is established there is a 2-7% risk per year of liver cancer and liver failure.  I discussed the importance of treatment and benefits in reducing the risk, even if significant liver fibrosis exists.   Plan: 1) Patient counseled extensively on limiting acetaminophen to no more than 2 grams daily, avoidance of alcohol. 2) Transmission discussed with patient including sexual transmission, sharing razors and toothbrush.   3) Will  need referral to gastroenterology if concern for cirrhosis 4) Will need referral for substance abuse counseling: Yes.  ; he will see our counselor today 5) Will prescribe appropriate medication based on genotype and coverage  6) Hepatitis A and B titers 7) Pneumovax vaccine today 8) Further work up to include liver staging with elastography 9) will follow up after elastography He will also call or bring by his medicaid information as he feels he is qualified for [facility].   He will also sign for Patient Access for medication in case his medicaid does not go through [place] readiness form signed

## 2018-04-14 LAB — COMPLETE METABOLIC PANEL WITH GFR
AG Ratio: 1.1 (calc) (ref 1.0–2.5)
ALT: 26 U/L (ref 9–46)
AST: 33 U/L (ref 10–35)
Albumin: 4.1 g/dL (ref 3.6–5.1)
Alkaline phosphatase (APISO): 98 U/L (ref 40–115)
BUN: 10 mg/dL (ref 7–25)
CO2: 26 mmol/L (ref 20–32)
Calcium: 9.9 mg/dL (ref 8.6–10.3)
Chloride: 102 mmol/L (ref 98–110)
Creat: 0.87 mg/dL (ref 0.70–1.33)
GFR, Est African American: 109 mL/min/{1.73_m2} (ref >=60)
GFR, Est Non African American: 94 mL/min/{1.73_m2} (ref >=60)
Globulin: 3.7 g/dL (calc) (ref 1.9–3.7)
Glucose, Bld: 88 mg/dL (ref 65–99)
Potassium: 4.1 mmol/L (ref 3.5–5.3)
Sodium: 137 mmol/L (ref 135–146)
Total Bilirubin: 0.5 mg/dL (ref 0.2–1.2)
Total Protein: 7.8 g/dL (ref 6.1–8.1)

## 2018-04-14 LAB — CBC WITH DIFFERENTIAL/PLATELET
Basophils Absolute: 31 cells/uL (ref 0–200)
Basophils Relative: 0.7 %
Eosinophils Absolute: 22 cells/uL (ref 15–500)
Eosinophils Relative: 0.5 %
HCT: 41.9 % (ref 38.5–50.0)
Hemoglobin: 14.9 g/dL (ref 13.2–17.1)
Lymphs Abs: 836 cells/uL — ABNORMAL LOW (ref 850–3900)
MCH: 32 pg (ref 27.0–33.0)
MCHC: 35.6 g/dL (ref 32.0–36.0)
MCV: 89.9 fL (ref 80.0–100.0)
MPV: 10.9 fL (ref 7.5–12.5)
Monocytes Relative: 12.2 %
Neutro Abs: 2974 cells/uL (ref 1500–7800)
Neutrophils Relative %: 67.6 %
Platelets: 213 10*3/uL (ref 140–400)
RBC: 4.66 10*6/uL (ref 4.20–5.80)
RDW: 12.6 % (ref 11.0–15.0)
Total Lymphocyte: 19 %
WBC mixed population: 537 cells/uL (ref 200–950)
WBC: 4.4 10*3/uL (ref 3.8–10.8)

## 2018-04-14 LAB — PROTIME-INR
INR: 1
Prothrombin Time: 10.4 s (ref 9.0–11.5)

## 2018-04-14 LAB — HIV ANTIBODY (ROUTINE TESTING W REFLEX): HIV 1&2 Ab, 4th Generation: NONREACTIVE

## 2018-04-14 LAB — HEPATITIS A ANTIBODY, TOTAL: Hepatitis A AB,Total: NONREACTIVE

## 2018-04-14 LAB — HEPATITIS B SURFACE ANTIGEN: Hepatitis B Surface Ag: NONREACTIVE

## 2018-04-14 LAB — HEPATITIS B SURFACE ANTIBODY,QUALITATIVE: HEP B S AB: NONREACTIVE

## 2018-04-20 ENCOUNTER — Ambulatory Visit (HOSPITAL_COMMUNITY)
Admission: RE | Admit: 2018-04-20 | Discharge: 2018-04-20 | Disposition: A | Discharge status: Home / Self Care | Payer: Medicaid Other | Source: Ambulatory Visit | Attending: Internal Medicine | Admitting: Internal Medicine

## 2018-04-20 DIAGNOSIS — B182 Chronic viral hepatitis C: Secondary | ICD-10-CM | POA: Diagnosis not present

## 2018-04-25 ENCOUNTER — Telehealth: Payer: Self-pay | Admitting: *Deleted

## 2018-04-25 NOTE — Telephone Encounter (Signed)
-----   Message from Gardiner Barefootobert W Comer, MD sent at 04/25/2018 11:07 AM EDT ----- He was supposed to have follow up with me after the elastography but does not seem to have been scheduled? His HCV viral load and genotype don't appear to have been done either.  Can you check with the lab if that is true? We can do that at his folllow up appt.  thanks

## 2018-04-25 NOTE — Telephone Encounter (Signed)
RN attempted to reach patient, but phone numbers listed belong to [facility] and [facility]. Both were closed for the day. Per Clydie BraunKaren, the labs were not drawn.  RN will attempt to contact patient tomorrow during business hours to schedule a follow up appointment. [facility] Andree CossHowell, Torrie Namba M, RN

## 2018-05-04 ENCOUNTER — Ambulatory Visit (INDEPENDENT_AMBULATORY_CARE_PROVIDER_SITE_OTHER): Payer: Medicaid Other | Admitting: Licensed Clinical Social Worker

## 2018-05-04 ENCOUNTER — Ambulatory Visit (INDEPENDENT_AMBULATORY_CARE_PROVIDER_SITE_OTHER): Payer: Medicaid Other | Admitting: Internal Medicine

## 2018-05-04 ENCOUNTER — Encounter: Payer: Self-pay | Admitting: Internal Medicine

## 2018-05-04 VITALS — Wt 158.0 lb

## 2018-05-04 DIAGNOSIS — F1010 Alcohol abuse, uncomplicated: Secondary | ICD-10-CM | POA: Diagnosis not present

## 2018-05-04 DIAGNOSIS — B182 Chronic viral hepatitis C: Secondary | ICD-10-CM | POA: Diagnosis not present

## 2018-05-04 DIAGNOSIS — K7400 Hepatic fibrosis, unspecified: Secondary | ICD-10-CM | POA: Insufficient documentation

## 2018-05-04 DIAGNOSIS — Z7185 Encounter for immunization safety counseling: Secondary | ICD-10-CM | POA: Insufficient documentation

## 2018-05-04 DIAGNOSIS — Z7189 Other specified counseling: Secondary | ICD-10-CM | POA: Diagnosis not present

## 2018-05-04 DIAGNOSIS — Z23 Encounter for immunization: Secondary | ICD-10-CM

## 2018-05-04 MED ORDER — SOFOSBUVIR-VELPATASVIR 400-100 MG PO TABS: 1.0000 | ORAL_TABLET | Freq: Every day | ORAL | 2 refills | Status: DC

## 2018-05-04 NOTE — Progress Notes (Signed)
   Subjective:    Patient ID: [name], male    DOB: 1956-01-24, 62 y.o.   MRN: [id number]  HPI He is here for a work in visit. He has a history of chronic hepatitis C and I saw him as a new patient earlier this month.  He is hepatitis A and B nonimmune, though does have hepatitis B core antibody that is positive.  Unfortunately the genotype and fiber sure had not been done.  He though has had the ultrasound with elastography and shows no cirrhosis on the ultrasound and elastography with F 0/1 disease with minimal fibrosis.  He is applying for Medicaid.  He is cutting down his alcohol intake and is down to just 1 can of beer a day.  He has not experienced any shaking or significant issues with his alcohol decrease.  He has noted some recent fogginess and fatigue that he attributes to recent diet changes and decrease in his alcohol intake.   Review of Systems  Constitutional: Positive for fatigue.  Neurological: Positive for light-headedness. Negative for headaches.       Objective:   Physical Exam  Constitutional: He appears well-developed and well-nourished. No distress.  HENT:  Mouth/Throat: No oropharyngeal exudate.  Eyes: No scleral icterus.  Cardiovascular: Normal rate, regular rhythm and normal heart sounds.  No murmur heard. Pulmonary/Chest: Effort normal and breath sounds normal. No respiratory distress.  Skin: No rash noted.    SH: much less daily alcohol intake      Assessment & Plan:

## 2018-05-04 NOTE — Assessment & Plan Note (Addendum)
I will have him do his genotype today and once that is available he can start treatment though he has signed the appropriate documentation for patient assistance through the manufacture to get a medication while he waits for Medicaid.  My suspicion is he will not be eligible for Medicaid. I will have him return to see me in 2 weeks to get him the medication since he has no contact information.

## 2018-05-04 NOTE — Assessment & Plan Note (Signed)
He was counseled on the hepatitis A and B vaccine and this was given today.

## 2018-05-04 NOTE — Assessment & Plan Note (Signed)
He has minimal scarring on the elastography so no indication for ongoing HCC screening.  He was reminded of the importance of reducing his alcohol intake.
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
## 2018-05-09 LAB — HEPATITIS C GENOTYPE

## 2018-05-09 LAB — HCV RNA,QN PCR RFLX GENO, LIPABAD
HCV RNA, PCR, QN (LOG): 6.46 {Log_IU}/mL — AB
HCV RNA, PCR, QN: 2880000 IU/mL — ABNORMAL HIGH

## 2018-05-12 ENCOUNTER — Other Ambulatory Visit: Payer: Self-pay | Admitting: *Deleted

## 2018-05-12 MED ORDER — SOFOSBUVIR-VELPATASVIR 400-100 MG PO TABS: 1.0000 | ORAL_TABLET | Freq: Every day | ORAL | 2 refills | Status: DC

## 2018-05-16 ENCOUNTER — Other Ambulatory Visit: Payer: Self-pay | Admitting: Pharmacist

## 2018-05-16 DIAGNOSIS — B182 Chronic viral hepatitis C: Secondary | ICD-10-CM

## 2018-05-16 MED ORDER — GLECAPREVIR-PIBRENTASVIR 100-40 MG PO TABS: 3.0000 | ORAL_TABLET | Freq: Every day | ORAL | 1 refills | Status: DC

## 2018-05-16 NOTE — Progress Notes (Signed)
Patient was approved for Medicaid.  Will send in Mavyret to [facility] and Kathie Rhodes will start working on PA. F0/F1, so sent in Rx for 8 weeks. Will d/c Epclusa.

## 2018-05-19 MED FILL — MAVYRET 100-40 MG TABS: 100-40 | 28 days supply | Qty: 84 | Fill #0

## 2018-05-23 ENCOUNTER — Encounter: Payer: Self-pay | Admitting: Pharmacy Technician

## 2018-05-23 ENCOUNTER — Telehealth: Payer: Self-pay

## 2018-05-23 NOTE — Telephone Encounter (Signed)
[name] presents to clinic to start hepatitis C treatment and pick up his first month of Mavyret.  I counseled him that he needs to take all 3 tablets at the same time with food each day. I also counseled him on potential side effects of fatigue and headache. I emphasized that he needs to take this every day and that he should not miss any doses. [name] verbalized understanding. He will start his medication today, 05/23/18. We scheduled him to come for his 1 month follow-up and to pick up his 2nd fill of Mavyret on Novemeber 6 at 3:15 PM.  Amanda Pea, Ilda Basset D PGY1 Pharmacy Resident  05/23/2018      1:52 PM

## 2018-06-01 ENCOUNTER — Ambulatory Visit (INDEPENDENT_AMBULATORY_CARE_PROVIDER_SITE_OTHER): Payer: Medicaid Other | Admitting: Behavioral Health

## 2018-06-01 ENCOUNTER — Ambulatory Visit (INDEPENDENT_AMBULATORY_CARE_PROVIDER_SITE_OTHER): Payer: Medicaid Other | Admitting: Licensed Clinical Social Worker

## 2018-06-01 DIAGNOSIS — F1010 Alcohol abuse, uncomplicated: Secondary | ICD-10-CM

## 2018-06-01 DIAGNOSIS — B182 Chronic viral hepatitis C: Secondary | ICD-10-CM

## 2018-06-01 DIAGNOSIS — Z23 Encounter for immunization: Secondary | ICD-10-CM | POA: Diagnosis not present

## 2018-06-01 NOTE — BH Specialist Note (Signed)
Integrated Behavioral Health Follow Up Visit  MRN: [id number] Name: Ademide Schaberg  Number of Integrated Behavioral Health Clinician visits: 3/6 Session Start time: 9:50am  Session End time: 10:20am Total time: 30 minutes  Type of Service: Integrated Behavioral Health- Individual/Family Interpretor:No. Interpretor Name and Language: n/a  SUBJECTIVE: Tajai Ihde is a 62 y.o. 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 He indicates that he has been staying with his cousin temporarily and that she was recently admitted to [facility]. She has a history of recent stroke and heart problems. Patient states that he is going to visit her after leaving the clinic today.   GOALS ADDRESSED: Patient will: 1.  Reduce symptoms of: substance abuse   INTERVENTIONS: Interventions utilized:  Motivational Interviewing and Relapse Prevention Therapy   ASSESSMENT: Patient currently experiencing difficulty sleeping and fatigue. He denies any symptoms of withdrawal, though he does state that he "feels strange" and isn't sure what is different. Counselor pointed out that not only is patient's body adjusting to not having alcohol and cocaine, it's also adjusting to a new medication, so it may take a while to do so. Counselor and patient explored situations over the last 9 days when patient has been offered or in contact with drugs and alcohol. Patient reports that friends have offered him beer, offered him a hit of crack, and remarked that it seems strange to see him without a can in his hand. He states that he has told each of them that he is on medication now  and does not want to risk his life by drinking or using. Patient reports that some friends have pushed him, saying that he's fine and doesn't need the medication, but his life means more to him than that. He denies any cravings, but states that it is hard sometimes to remember he is sober now, due to the habit of using and drinking. Patient commended patient for resisting offers. Counselor guided patient to explored times when it may be more difficult for him to refuse, or situations in which he may be more tempted to drink or use. Patient stated that it is most difficult when he is in the company of women with whom he is involved sexually. He reports that this is just a part of the way things happen, but he did have one woman ask him to buy her a drink and replied to her that he does not drink anymore and will not be around her if she is drinking. Counselor praised patient for creating and setting boundaries around his wellness.    Patient may benefit from monthly check ins with counselor regarding recovery and mood. Counselor strongly encouraged patient to contact clinic if he needs support before then, and to become engaged in the recovery community.  PLAN: 1. Follow up with behavioral health clinician on : 06/29/18 @ 11am  Angus Palms, LCSW

## 2018-06-07 ENCOUNTER — Encounter: Payer: Medicaid Other | Admitting: Nurse Practitioner

## 2018-06-15 ENCOUNTER — Ambulatory Visit (INDEPENDENT_AMBULATORY_CARE_PROVIDER_SITE_OTHER): Payer: Medicaid Other | Admitting: Pharmacist

## 2018-06-15 DIAGNOSIS — B182 Chronic viral hepatitis C: Secondary | ICD-10-CM

## 2018-06-15 MED FILL — MAVYRET 100-40 MG TABS: 100-40 | 28 days supply | Qty: 84 | Fill #1

## 2018-06-15 NOTE — Progress Notes (Signed)
HPI: [name] is a 62 y.o. male who presents to the [facility] pharmacy clinic for hepatitis C follow-up. He has genotype 1a, F0/F1, and started taking 8 weeks of Mavyret on 05/23/18.  Patient Active Problem List   Diagnosis Date Noted  . Liver fibrosis 05/04/2018  . Vaccine counseling 05/04/2018  . Chronic hepatitis C without hepatic coma (HCC) 04/13/2018  . Closed displaced trimalleolar fracture of left ankle 10/16/2017  . Ankle dislocation, left, initial encounter 10/14/2017    Patient's Medications  New Prescriptions   No medications on file  Previous Medications   GLECAPREVIR-PIBRENTASVIR (MAVYRET) 100-40 MG TABS    Take 3 tablets by mouth daily with breakfast.  Modified Medications   No medications on file  Discontinued Medications   No medications on file    Allergies: No Known Allergies  Past Medical History: Past Medical History:  Diagnosis Date  . History of rib fracture     Social History: Social History   Socioeconomic History  . Marital status: Single    Spouse name: Not on file  . Number of children: Not on file  . Years of education: Not on file  . Highest education level: Not on file  Occupational History  . Not on file  Social Needs  . Financial resource strain: Not on file  . Food insecurity:    Worry: Not on file    Inability: Not on file  . Transportation needs:    Medical: Not on file    Non-medical: Not on file  Tobacco Use  . Smoking status: Never Smoker  . Smokeless tobacco: Never Used  Substance and Sexual Activity  . Alcohol use: Yes    Alcohol/week: 42.0 standard drinks    Types: 42 Cans of beer per week    Comment: everyday   . Drug use: Yes    Types: "Crack" cocaine    Comment: every once in a while (twice a month)  . Sexual activity: Yes    Partners: Female  Lifestyle  . Physical activity:    Days per week: Not on file    Minutes per session: Not on file  . Stress: Not on file  Relationships  . Social connections:      Talks on phone: Not on file    Gets together: Not on file    Attends religious service: Not on file    Active member of club or organization: Not on file    Attends meetings of clubs or organizations: Not on file    Relationship status: Not on file  Other Topics Concern  . Not on file  Social History Narrative  . Not on file    Labs: Hepatitis C Lab Results  Component Value Date   HCVGENOTYPE 1a 05/04/2018   HCVRNAPCRQN 2,880,000 (H) 05/04/2018   Hepatitis B Lab Results  Component Value Date   HEPBSAB NON-REACTIVE 04/13/2018   HEPBSAG NON-REACTIVE 04/13/2018   Hepatitis A Lab Results  Component Value Date   HAV NON-REACTIVE 04/13/2018   HIV Lab Results  Component Value Date   HIV NON-REACTIVE 04/13/2018   HIV Non Reactive 10/15/2017   HIV NONREACTIVE 07/06/2016   Lab Results  Component Value Date   CREATININE 0.87 04/13/2018   CREATININE 1.03 10/14/2017   CREATININE 0.95 07/06/2016   Lab Results  Component Value Date   AST 33 04/13/2018   AST 29 10/14/2017   ALT 26 04/13/2018   ALT 27 10/14/2017   INR 1.0 04/13/2018   INR 0.99  10/14/2017   Assessment: [name] is here today for hepatitis C follow up. He has been taking Mavyret for ~3 weeks and reports doing well. He has not noticed any significant side effects except for one migraine early on in treatment and feeling somewhat sluggish. However, he is cutting back on drinking as well and thinks it may be more related to that and not the medication. He is taking all three tablets at once and has not missed any doses. His next box of Mavyret is set to be delivered to [facility] tomorrow. He will come by tomorrow afternoon or Friday morning to pick it up. He currently has enough Mavyret left to last until Sunday.  [name] started both the hepatitis A and B immunization series in 04/2018 and received the second hepatitis B shot in 05/2018. He will be due for his last hepatitis A and B shots in 10/2018.  Plan: HCV RNA F/u  at [facility] on 12/11 @ 1045  [name], PharmD PGY2 Infectious Diseases Pharmacy Resident Phone: [phone] 06/15/2018, 3:22 PM

## 2018-06-17 LAB — HEPATITIS C RNA QUANTITATIVE
HCV Quantitative Log: <1.18 Log IU/mL
HCV RNA, PCR, QN: <15 IU/mL

## 2018-06-22 ENCOUNTER — Ambulatory Visit: Payer: Medicaid Other | Attending: Nurse Practitioner | Admitting: Nurse Practitioner

## 2018-06-22 ENCOUNTER — Encounter: Payer: Self-pay | Admitting: Nurse Practitioner

## 2018-06-22 VITALS — BP 127/89 | HR 79 | Temp 98.3°F | Ht 64.0 in | Wt 162.0 lb

## 2018-06-22 DIAGNOSIS — B182 Chronic viral hepatitis C: Secondary | ICD-10-CM | POA: Insufficient documentation

## 2018-06-22 DIAGNOSIS — Z0000 Encounter for general adult medical examination without abnormal findings: Secondary | ICD-10-CM

## 2018-06-22 DIAGNOSIS — E782 Mixed hyperlipidemia: Secondary | ICD-10-CM | POA: Insufficient documentation

## 2018-06-22 DIAGNOSIS — K7460 Unspecified cirrhosis of liver: Secondary | ICD-10-CM | POA: Diagnosis not present

## 2018-06-22 DIAGNOSIS — Z79899 Other long term (current) drug therapy: Secondary | ICD-10-CM | POA: Diagnosis not present

## 2018-06-22 DIAGNOSIS — Z1211 Encounter for screening for malignant neoplasm of colon: Secondary | ICD-10-CM | POA: Diagnosis not present

## 2018-06-22 NOTE — Progress Notes (Signed)
Assessment & Plan:  [name] was seen today for annual exam.  Diagnoses and all orders for this visit:  Encounter for routine history and physical exam for male  Mixed hyperlipidemia -     Lipid panel INSTRUCTIONS: Work on a low fat, heart healthy diet and participate in regular aerobic exercise program by working out at least 150 minutes per week; 5 days a week-30 minutes per day. Avoid red meat, fried foods. junk foods, sodas, sugary drinks, unhealthy snacking, alcohol and smoking.  Drink at least 48oz of water per day and monitor your carbohydrate intake daily.    Colon cancer screening -     Fecal occult blood, imunochemical([facility])    Patient has been counseled on age-appropriate routine health concerns for screening and prevention. These are reviewed and up-to-date. Referrals have been placed accordingly. Immunizations are up-to-date or declined.    Subjective:   Chief Complaint  Patient presents with  . Annual Exam    Pt. is here for a physical and have not eat anything yet.    HPI [name] 62 y.o. male presents to office today for annual physical exam. He is being followed by ID for chronic Hep C with Cirrhosis. Recently started taking Mavyret and currently denies any adverse side effects.   Review of Systems  Constitutional: Negative for fever, malaise/fatigue and weight loss.  HENT: Negative.  Negative for nosebleeds.   Eyes: Negative.  Negative for blurred vision, double vision and photophobia.  Respiratory: Negative.  Negative for cough and shortness of breath.   Cardiovascular: Negative.  Negative for chest pain, palpitations and leg swelling.  Gastrointestinal: Negative.  Negative for heartburn, nausea and vomiting.  Genitourinary: Negative.   Musculoskeletal: Negative.  Negative for myalgias.  Skin: Negative.   Neurological: Negative.  Negative for dizziness, focal weakness, seizures and headaches.  Endo/Heme/Allergies: Negative.     Psychiatric/Behavioral: Negative.  Negative for suicidal ideas.    Past Medical History:  Diagnosis Date  . History of rib fracture     Past Surgical History:  Procedure Laterality Date  . ORIF ANKLE FRACTURE Left 10/15/2017   Procedure: OPEN REDUCTION INTERNAL FIXATION (ORIF) ANKLE FRACTURE;  Surgeon: Roby Lofts, MD;  Location: [facility] OR;  Service: Orthopedics;  Laterality: Left;  . ORIF ANKLE FRACTURE Left 10/18/2017   Procedure: OPEN REDUCTION INTERNAL FIXATION (ORIF) ANKLE FRACTURE;  Surgeon: Roby Lofts, MD;  Location: [facility] OR;  Service: Orthopedics;  Laterality: Left;    Family History  Problem Relation Age of Onset  . Diabetes Neg Hx   . Hypertension Neg Hx     Social History Reviewed with no changes to be made today.   Outpatient Medications Prior to Visit  Medication Sig Dispense Refill  . Glecaprevir-Pibrentasvir (MAVYRET) 100-40 MG TABS Take 3 tablets by mouth daily with breakfast. 84 tablet 1   No facility-administered medications prior to visit.     No Known Allergies     Objective:    BP 127/89 (BP Location: Left Arm, Patient Position: Sitting, Cuff Size: Normal)   Pulse 79   Temp 98.3 F (36.8 C) (Oral)   Ht 5\' 4"  (1.626 m)   Wt 162 lb (73.5 kg)   SpO2 97%   BMI 27.81 kg/m  Wt Readings from Last 3 Encounters:  06/22/18 162 lb (73.5 kg)  05/04/18 158 lb (71.7 kg)  04/13/18 156 lb (70.8 kg)    Physical Exam  Constitutional: He is oriented to person, place, and time. He appears well-developed and  well-nourished.  HENT:  Head: Normocephalic and atraumatic.  Right Ear: Hearing, tympanic membrane, external ear and ear canal normal.  Left Ear: Hearing, tympanic membrane, external ear and ear canal normal.  Nose: Nose normal. No mucosal edema or rhinorrhea.  Mouth/Throat: Uvula is midline, oropharynx is clear and moist and mucous membranes are normal. Abnormal dentition. Dental caries present. Tonsils are 1+ on the right. Tonsils are 1+ on the left.  No tonsillar exudate.  Eyes: Pupils are equal, round, and reactive to light. Conjunctivae, EOM and lids are normal. No scleral icterus.  Neck: Normal range of motion. Neck supple. No tracheal deviation present. No thyromegaly present.  Cardiovascular: Normal rate, regular rhythm, normal heart sounds and intact distal pulses. Exam reveals no gallop and no friction rub.  No murmur heard. Pulmonary/Chest: Effort normal and breath sounds normal. No stridor. No respiratory distress. He has no wheezes. He has no rales. He exhibits no mass and no tenderness. Right breast exhibits no inverted nipple, no mass, no nipple discharge, no skin change and no tenderness. Left breast exhibits no inverted nipple, no mass, no nipple discharge, no skin change and no tenderness.  Abdominal: Soft. Bowel sounds are normal. He exhibits no distension and no mass. There is no tenderness. There is no rebound and no guarding. Hernia confirmed negative in the right inguinal area and confirmed negative in the left inguinal area.  Genitourinary: Testes normal and penis normal. Right testis shows no mass, no swelling and no tenderness. Right testis is descended. Cremasteric reflex is not absent on the right side. Left testis shows no mass, no swelling and no tenderness. Left testis is descended. Cremasteric reflex is not absent on the left side. Circumcised.  Musculoskeletal: Normal range of motion. He exhibits no edema, tenderness or deformity.  Lymphadenopathy:    He has no cervical adenopathy. No inguinal adenopathy noted on the right or left side.       Right: No inguinal adenopathy present.       Left: No inguinal adenopathy present.  Neurological: He is alert and oriented to person, place, and time. He displays normal reflexes. No cranial nerve deficit. He exhibits normal muscle tone. Coordination normal.  Skin: Skin is warm and dry. Capillary refill takes less than 2 seconds. No erythema.  Psychiatric: He has a normal mood  and affect. His behavior is normal. Judgment and thought content normal.       Patient has been counseled extensively about nutrition and exercise as well as the importance of adherence with medications and regular follow-up. The patient was given clear instructions to go to ER or return to medical center if symptoms don't improve, worsen or new problems develop. The patient verbalized understanding.   Follow-up: Return if symptoms worsen or fail to improve.   Claiborne RiggZelda W Zariyah Stephens, FNP-BC [facility] [place], [place] [phone]   06/22/2018, 7:44 PM

## 2018-06-23 ENCOUNTER — Ambulatory Visit: Payer: Medicaid Other

## 2018-06-23 ENCOUNTER — Telehealth: Payer: Self-pay | Admitting: Pharmacist

## 2018-06-23 LAB — LIPID PANEL
CHOL/HDL RATIO: 2.8 ratio (ref 0.0–5.0)
CHOLESTEROL TOTAL: 235 mg/dL — AB (ref 100–199)
HDL: 83 mg/dL (ref >39)
LDL Calculated: 139 mg/dL — ABNORMAL HIGH (ref 0–99)
TRIGLYCERIDES: 64 mg/dL (ref 0–149)
VLDL Cholesterol Cal: 13 mg/dL (ref 5–40)

## 2018-06-23 NOTE — Telephone Encounter (Signed)
I called [name] to clarify his HCV RNA results. He spoke to someone else earlier today who told him that he was "cured" so I wanted to ensure that he knew he was supposed to finish the entire 8 weeks of Mavyret regardless of his current viral load. [name] confirmed that he understood he is to finish the complete course of Mavyret and we will continue to check his HCV RNA to determine whether or not he is cured.

## 2018-06-27 ENCOUNTER — Telehealth: Payer: Self-pay

## 2018-06-27 NOTE — Telephone Encounter (Signed)
-----   Message from Claiborne RiggZelda W Fleming, NP sent at 06/23/2018  8:45 PM EST ----- Cholesterol levels are high. INSTRUCTIONS: Work on a low fat, heart healthy diet and participate in regular aerobic exercise program by working out at least 150 minutes per week; 5 days a week-30 minutes per day. Avoid red meat, fried foods. junk foods, sodas, sugary drinks, unhealthy snacking, alcohol and smoking.  Drink at least 48oz of water per day and monitor your carbohydrate intake daily.

## 2018-06-27 NOTE — Telephone Encounter (Signed)
CMA attempt to reach patient.  Wrong phone number and unable to reach patient.  A letter will be send out to reach patient.   If patient call back, please inform:  Cholesterol levels are high. INSTRUCTIONS: Work on a low fat, heart healthy diet and participate in regular aerobic exercise program by working out at least 150 minutes per week; 5 days a week-30 minutes per day. Avoid red meat, fried foods. junk foods, sodas, sugary drinks, unhealthy snacking, alcohol and smoking.  Drink at least 48oz of water per day and monitor your carbohydrate intake daily.

## 2018-06-29 ENCOUNTER — Ambulatory Visit (INDEPENDENT_AMBULATORY_CARE_PROVIDER_SITE_OTHER): Payer: Medicaid Other | Admitting: Licensed Clinical Social Worker

## 2018-06-29 DIAGNOSIS — F1010 Alcohol abuse, uncomplicated: Secondary | ICD-10-CM

## 2018-06-29 NOTE — BH Specialist Note (Signed)
Integrated Behavioral Health Follow Up Visit  MRN: [id number] Name: [name]  Number of Integrated Behavioral Health Clinician visits: 4/6 Session Start time: 11:15 am   Session End time: 11:45am Total time: 30 minutes  Type of Service: Integrated Behavioral Health- Individual/Family Interpretor:No. Interpretor Name and Language: n/a  SUBJECTIVE: [name] is a 62 y.o. male accompanied by self Patient reports the following symptoms/concerns: some fatigue, trouble with memory  OBJECTIVE: Mood: Depressed and Affect: Constricted Risk of harm to self or others: No plan to harm self or others  LIFE CONTEXT: Patient reports he has not had a drink since beginning medication for Hepatitis C about a month ago. He states that he doesn't feel "normal", but that he began drinking so young he does not remember what normal felt like before then. Patient states that he has not been getting into fights lately, and that he has [facility]. His hope is that when he finishes the remainder of his medication and is not longer in pain with his legs he will be able to get a part time job. He states that he has no intention to drink again, even after he finishes the medication.  GOALS ADDRESSED: Patient will: 1.  Reduce symptoms of: substance abuse   INTERVENTIONS: Interventions utilized:  Behavioral Activation and Supportive Counseling  ASSESSMENT: Patient currently experiencing some fatigue, trouble remembering, and some body pain. He denies any specific side effects of medication or ongoing PAWS. Counselor and patient discussed how patient feels, mentally and physically, now that he is not drinking. Patient identified that he has experienced some embarrassment with telling people why he is no longer drinking, especially women he is sexually active with, but that he has done so because his health is important enough to him for them to know it is  imperative that he not drink. Counselor commended patient on this, and explored with him ways that he has been taking care of himself since on the medication. Counselor pointed out that it is a major accomplishment for him to go without drinking this long, since he has never done this in at least 40 years of drinking. Patient reports that he is proud of himself, and that he understands that he cannot go back to drinking after treatment for Hepatitis ends.   Patient may benefit from continued counseling for Relapse Prevention.  PLAN: 1. Follow up with behavioral health clinician on : 08/16/18 @11am  Angus Palmsegina Alexander, LCSW

## 2018-07-20 ENCOUNTER — Ambulatory Visit (INDEPENDENT_AMBULATORY_CARE_PROVIDER_SITE_OTHER): Payer: Medicaid Other | Admitting: Pharmacist

## 2018-07-20 DIAGNOSIS — B182 Chronic viral hepatitis C: Secondary | ICD-10-CM | POA: Diagnosis present

## 2018-07-20 NOTE — Patient Instructions (Signed)
[facility] - [phone].  Great seeing you today!

## 2018-07-20 NOTE — Progress Notes (Signed)
HPI: [name] is a 62 y.o. male who presents to the [facility] pharmacy clinic for follow-up of his Hepatitis C infection.  Medication: Mavyret x 8 weeks  Start Date: 10/14  Hepatitis C Genotype: 1a  Fibrosis Score: F0/F1  Hepatitis C RNA: 2.8 mil on 9/25, <15 on 11/6  Patient Active Problem List   Diagnosis Date Noted  . Liver fibrosis 05/04/2018  . Vaccine counseling 05/04/2018  . Chronic hepatitis C without hepatic coma (HCC) 04/13/2018  . Closed displaced trimalleolar fracture of left ankle 10/16/2017  . Ankle dislocation, left, initial encounter 10/14/2017    Patient's Medications  New Prescriptions   No medications on file  Previous Medications   GLECAPREVIR-PIBRENTASVIR (MAVYRET) 100-40 MG TABS    Take 3 tablets by mouth daily with breakfast.  Modified Medications   No medications on file  Discontinued Medications   No medications on file    Allergies: No Known Allergies  Past Medical History: Past Medical History:  Diagnosis Date  . History of rib fracture     Social History: Social History   Socioeconomic History  . Marital status: Single    Spouse name: Not on file  . Number of children: Not on file  . Years of education: Not on file  . Highest education level: Not on file  Occupational History  . Not on file  Social Needs  . Financial resource strain: Not on file  . Food insecurity:    Worry: Not on file    Inability: Not on file  . Transportation needs:    Medical: Not on file    Non-medical: Not on file  Tobacco Use  . Smoking status: Never Smoker  . Smokeless tobacco: Never Used  Substance and Sexual Activity  . Alcohol use: Yes    Alcohol/week: 42.0 standard drinks    Types: 42 Cans of beer per week    Comment: everyday   . Drug use: Yes    Types: "Crack" cocaine    Comment: every once in a while (twice a month)  . Sexual activity: Yes    Partners: Female  Lifestyle  . Physical activity:    Days per week: Not on file   Minutes per session: Not on file  . Stress: Not on file  Relationships  . Social connections:    Talks on phone: Not on file    Gets together: Not on file    Attends religious service: Not on file    Active member of club or organization: Not on file    Attends meetings of clubs or organizations: Not on file    Relationship status: Not on file  Other Topics Concern  . Not on file  Social History Narrative  . Not on file    Labs: Hepatitis C Lab Results  Component Value Date   HCVGENOTYPE 1a 05/04/2018   HCVRNAPCRQN <15 NOT DETECTED 06/15/2018   HCVRNAPCRQN 2,880,000 (H) 05/04/2018   Hepatitis B Lab Results  Component Value Date   HEPBSAB NON-REACTIVE 04/13/2018   HEPBSAG NON-REACTIVE 04/13/2018   Hepatitis A Lab Results  Component Value Date   HAV NON-REACTIVE 04/13/2018   HIV Lab Results  Component Value Date   HIV NON-REACTIVE 04/13/2018   HIV Non Reactive 10/15/2017   HIV NONREACTIVE 07/06/2016   Lab Results  Component Value Date   CREATININE 0.87 04/13/2018   CREATININE 1.03 10/14/2017   CREATININE 0.95 07/06/2016   Lab Results  Component Value Date   AST 33 04/13/2018  AST 29 10/14/2017   ALT 26 04/13/2018   ALT 27 10/14/2017   INR 1.0 04/13/2018   INR 0.99 10/14/2017    Assessment: [name] is here today for his [facility] Hepatitis C appointment.  He completed 8 weeks of Mavyret last Tuesday without any issues.  He states he missed no doses and tolerated it well except for some headaches at the beginning.  He did receive both boxes. His early on treatment Hep C viral load was already undetectable. Will check another viral load today and see him back in 3 months for his cure visit.   Plan: - Completed Mavyret x 8 weeks - Hep C RNA today - F/u with me again 3/18 at 1045am  Cassie L. Kuppelweiser, PharmD, BCIDP, AAHIVP, CPP Infectious Diseases Clinical Pharmacist [facility] 07/20/2018, 4:12 PM

## 2018-07-22 LAB — HEPATITIS C RNA QUANTITATIVE
HCV Quantitative Log: <1.18 Log IU/mL
HCV RNA, PCR, QN: <15 IU/mL

## 2018-08-16 ENCOUNTER — Ambulatory Visit (INDEPENDENT_AMBULATORY_CARE_PROVIDER_SITE_OTHER): Payer: Medicaid Other | Admitting: Licensed Clinical Social Worker

## 2018-08-16 DIAGNOSIS — F1010 Alcohol abuse, uncomplicated: Secondary | ICD-10-CM

## 2018-08-17 NOTE — BH Specialist Note (Signed)
Integrated Behavioral Health Follow Up Visit  MRN: [id number] Name: [name]  Number of Integrated Behavioral Health Clinician visits: 5/6 Session Start time: 10:46am  Session End time: 11:15am Total time: 30 minutes  Type of Service: Integrated Behavioral Health- Individual/Family Interpretor:No. Interpretor Name and Language: n/a  SUBJECTIVE: [name] is a 63 y.o. male accompanied by self Patient reports the following symptoms/concerns: recent alcohol use, some trouble with memory and sleep  OBJECTIVE: Mood: Euthymic and Affect: Constricted Risk of harm to self or others: No plan to harm self or others  LIFE CONTEXT: Patient reports he has been doing some work through a temp service and hopes to continue this. He has put in an application with a program that may help him get a studio apartment.  GOALS ADDRESSED: Patient will: 1.  Reduce symptoms of: substance abuse    INTERVENTIONS: Interventions utilized:  Supportive Counseling and Relapse Prevention Therapy  ASSESSMENT: Patient currently experiencing much fewer symptoms. He reports that he was at a cousin's house for Christmas, and the cousin talked him into drinking about 2 glasses of wine. Counselor processed with patient his thoughts and feelings about this. Patient stated that he felt bad afterward, both physically and emotionally. He indicated that he did not beat himself up, but when someone offers him alcohol now, he remembers how badly it felt to drink at Christmas and this helps him to refuse. Counselor commended patient on this. Counselor and patient discussed obstacles to continued sobriety. Patient mentioned sexual relationships and family relationships, stating that people often just want him to drink with them in these situations. Counselor and patient rehearsed refusals. Counselor pointed out to patient that he has been working hard to take better care of himself, in terms of his diet, exercise, finances  and social situations. Counselor encouraged patient to continue abstinence from alcohol so that he can continue to keep these positive changes going.   Patient may benefit from monthly check ins for sobriety support and Relapse Prevention Counseling.  PLAN: 1. Follow up with behavioral health clinician on : 10/26/2018  Angus Palms, LCSW

## 2018-09-05 ENCOUNTER — Emergency Department (HOSPITAL_COMMUNITY)
Admission: EM | Admit: 2018-09-05 | Discharge: 2018-09-06 | Disposition: A | Discharge status: Home / Self Care | Payer: Medicaid Other | Attending: Emergency Medicine | Admitting: Emergency Medicine

## 2018-09-05 ENCOUNTER — Emergency Department (HOSPITAL_COMMUNITY): Payer: Medicaid Other

## 2018-09-05 ENCOUNTER — Encounter (HOSPITAL_COMMUNITY): Payer: Self-pay

## 2018-09-05 DIAGNOSIS — Z79899 Other long term (current) drug therapy: Secondary | ICD-10-CM | POA: Insufficient documentation

## 2018-09-05 DIAGNOSIS — R600 Localized edema: Secondary | ICD-10-CM | POA: Diagnosis not present

## 2018-09-05 DIAGNOSIS — R1084 Generalized abdominal pain: Secondary | ICD-10-CM | POA: Diagnosis not present

## 2018-09-05 DIAGNOSIS — R55 Syncope and collapse: Secondary | ICD-10-CM | POA: Diagnosis present

## 2018-09-05 DIAGNOSIS — R112 Nausea with vomiting, unspecified: Secondary | ICD-10-CM | POA: Diagnosis not present

## 2018-09-05 LAB — CBC
HCT: 45.2 % (ref 39.0–52.0)
Hemoglobin: 15 g/dL (ref 13.0–17.0)
MCH: 31.3 pg (ref 26.0–34.0)
MCHC: 33.2 g/dL (ref 30.0–36.0)
MCV: 94.4 fL (ref 80.0–100.0)
Platelets: 288 10*3/uL (ref 150–400)
RBC: 4.79 MIL/uL (ref 4.22–5.81)
RDW: 12.7 % (ref 11.5–15.5)
WBC: 8.7 10*3/uL (ref 4.0–10.5)
nRBC: 0 % (ref 0.0–0.2)

## 2018-09-05 LAB — BASIC METABOLIC PANEL
Anion gap: 11 (ref 5–15)
BUN: 9 mg/dL (ref 6–20)
CHLORIDE: 100 mmol/L (ref 98–111)
CO2: 28 mmol/L (ref 22–32)
Calcium: 10 mg/dL (ref 8.9–10.3)
Creatinine, Ser: 0.98 mg/dL (ref 0.61–1.24)
GFR calc Af Amer: >60 mL/min (ref >60)
GFR calc non Af Amer: >60 mL/min (ref >60)
Glucose, Bld: 114 mg/dL — ABNORMAL HIGH (ref 70–99)
POTASSIUM: 3.3 mmol/L — AB (ref 3.5–5.1)
Sodium: 139 mmol/L (ref 135–145)

## 2018-09-05 MED ORDER — SODIUM CHLORIDE 0.9% FLUSH: 3.0000 mL | Freq: Once | INTRAVENOUS | Status: DC

## 2018-09-05 NOTE — ED Triage Notes (Signed)
Pt BIB ems, was at work earlier today when he had sudden abd swelling and diarrhea pt then vomited once and had a syncopal episode. Witnesses states he did not hit his head. Pt still having a headache, 8/10. Pt given 4 zofran en route by ems. Denies any other pain, VSS.

## 2018-09-06 ENCOUNTER — Emergency Department (HOSPITAL_COMMUNITY): Payer: Medicaid Other

## 2018-09-06 LAB — BRAIN NATRIURETIC PEPTIDE: B Natriuretic Peptide: 18.2 pg/mL (ref 0.0–100.0)

## 2018-09-06 LAB — HEPATIC FUNCTION PANEL
ALT: 20 U/L (ref 0–44)
AST: 21 U/L (ref 15–41)
Albumin: 3.7 g/dL (ref 3.5–5.0)
Alkaline Phosphatase: 88 U/L (ref 38–126)
Bilirubin, Direct: 0.1 mg/dL (ref 0.0–0.2)
Indirect Bilirubin: 1 mg/dL — ABNORMAL HIGH (ref 0.3–0.9)
Total Bilirubin: 1.1 mg/dL (ref 0.3–1.2)
Total Protein: 8 g/dL (ref 6.5–8.1)

## 2018-09-06 LAB — TROPONIN I: Troponin I: <0.03 ng/mL (ref <0.03)

## 2018-09-06 LAB — LIPASE, BLOOD: Lipase: 25 U/L (ref 11–51)

## 2018-09-06 MED ORDER — SODIUM CHLORIDE 0.9 % IV BOLUS
1000.0000 mL | Freq: Once | INTRAVENOUS | Status: AC
  Administered 2018-09-06: 1000 mL via INTRAVENOUS

## 2018-09-06 MED ORDER — ONDANSETRON HCL 4 MG/2ML IJ SOLN
4.0000 mg | Freq: Once | INTRAMUSCULAR | Status: AC
  Administered 2018-09-06: 4 mg via INTRAVENOUS
  Filled 2018-09-06: qty 2

## 2018-09-06 MED ORDER — ONDANSETRON 4 MG PO TBDP: 4.0000 mg | ORAL_TABLET | Freq: Three times a day (TID) | ORAL | 0 refills | Status: DC | PRN

## 2018-09-06 MED FILL — ONDANSETRON ODT 4 MG TABLET: 4 | 6 days supply | Qty: 20 | Fill #0

## 2018-09-06 NOTE — ED Notes (Signed)
Patient verbalizes understanding of discharge instructions. Opportunity for questioning and answers were provided. Armband removed by staff, pt discharged from ED.  

## 2018-09-06 NOTE — ED Provider Notes (Signed)
[facility]   CSN: [id number] Arrival date & time: 09/05/18  1809     History   Chief Complaint Chief Complaint  Patient presents with  . Loss of Consciousness  . Emesis  . Headache    HPI [name] is a 63 y.o. male.  Patient presents after syncopal episode today.  States he started to feel poorly at work after eating a banana and roast beef which he believes was bad food.  He developed abdominal cramping and "swelling".  And one episode of diarrhea.  He went to the store to cash a check and then he began to feel dizzy, lightheaded, hot, sweaty with blurry vision and had a syncopal episode.  Witnesses say did not strike his head.  Denies any chest pain or shortness of breath.  He continues to have episodes of diarrhea.  He states he had a gradual onset headache while at the store denies thunderclap onset or sudden worsening of the headache.  States he does not member having a headache before he passed out.  No focal weakness, numbness or tingling.  No bowel or bladder incontinence.  No difficulty speaking or difficulty swallowing.  Patient with history of hepatitis C and previous alcohol abuse.  Admits to cocaine use 2 days ago.  States he has not passed out before and except in the setting of drinking. CT head obtained in triage is negative.  The history is provided by the patient and the EMS personnel.  Loss of Consciousness  Associated symptoms: dizziness, headaches, nausea and vomiting   Associated symptoms: no chest pain, no shortness of breath and no weakness   Emesis  Associated symptoms: diarrhea and headaches   Associated symptoms: no abdominal pain, no arthralgias and no cough   Headache  Associated symptoms: diarrhea, dizziness, nausea, syncope and vomiting   Associated symptoms: no abdominal pain, no congestion, no cough, no fatigue and no weakness     Past Medical History:  Diagnosis Date  . History of rib  fracture     Patient Active Problem List   Diagnosis Date Noted  . Liver fibrosis 05/04/2018  . Vaccine counseling 05/04/2018  . Chronic hepatitis C without hepatic coma (HCC) 04/13/2018  . Closed displaced trimalleolar fracture of left ankle 10/16/2017  . Ankle dislocation, left, initial encounter 10/14/2017    Past Surgical History:  Procedure Laterality Date  . ORIF ANKLE FRACTURE Left 10/15/2017   Procedure: OPEN REDUCTION INTERNAL FIXATION (ORIF) ANKLE FRACTURE;  Surgeon: Roby LoftsHaddix, Kevin P, MD;  Location: [facility] OR;  Service: Orthopedics;  Laterality: Left;  . ORIF ANKLE FRACTURE Left 10/18/2017   Procedure: OPEN REDUCTION INTERNAL FIXATION (ORIF) ANKLE FRACTURE;  Surgeon: Roby LoftsHaddix, Kevin P, MD;  Location: [facility] OR;  Service: Orthopedics;  Laterality: Left;        Home Medications    Prior to Admission medications   Medication Sig Start Date End Date Taking? Authorizing Provider  Glecaprevir-Pibrentasvir (MAVYRET) 100-40 MG TABS Take 3 tablets by mouth daily with breakfast. 05/16/18   Kuppelweiser, Cassie L, RPH-CPP    Family History Family History  Problem Relation Age of Onset  . Diabetes Neg Hx   . Hypertension Neg Hx     Social History Social History   Tobacco Use  . Smoking status: Never Smoker  . Smokeless tobacco: Never Used  Substance Use Topics  . Alcohol use: Yes    Alcohol/week: 42.0 standard drinks    Types: 42 Cans of beer per week  Comment: everyday   . Drug use: Yes    Types: "Crack" cocaine    Comment: every once in a while (twice a month)     Allergies   Patient has no known allergies.   Review of Systems Review of Systems  Constitutional: Positive for activity change and appetite change. Negative for fatigue.  HENT: Negative for congestion.   Respiratory: Negative for cough, chest tightness and shortness of breath.   Cardiovascular: Positive for syncope. Negative for chest pain.  Gastrointestinal: Positive for diarrhea, nausea and vomiting.  Negative for abdominal pain.  Genitourinary: Negative for dysuria, hematuria and testicular pain.  Musculoskeletal: Negative for arthralgias.  Neurological: Positive for dizziness, light-headedness and headaches. Negative for weakness.   all other systems are negative except as noted in the HPI and PMH.     Physical Exam Updated Vital Signs BP (!) 128/91   Pulse 95   Temp 98.3 F (36.8 C) (Oral)   Resp 16   SpO2 97%   Physical Exam Vitals signs and nursing note reviewed.  Constitutional:      General: He is not in acute distress.    Appearance: He is well-developed.  HENT:     Head: Normocephalic and atraumatic.     Mouth/Throat:     Pharynx: No oropharyngeal exudate.  Eyes:     Conjunctiva/sclera: Conjunctivae normal.     Pupils: Pupils are equal, round, and reactive to light.  Neck:     Musculoskeletal: Normal range of motion and neck supple.     Comments: No meningismus. Cardiovascular:     Rate and Rhythm: Normal rate and regular rhythm.     Heart sounds: Normal heart sounds. No murmur.     Comments: Equal femoral pulses Pulmonary:     Effort: Pulmonary effort is normal. No respiratory distress.     Breath sounds: Normal breath sounds.  Abdominal:     Palpations: Abdomen is soft.     Tenderness: There is no abdominal tenderness. There is no guarding or rebound.  Musculoskeletal: Normal range of motion.        General: No tenderness.  Skin:    General: Skin is warm.     Capillary Refill: Capillary refill takes less than 2 seconds.  Neurological:     General: No focal deficit present.     Mental Status: He is alert and oriented to person, place, and time. Mental status is at baseline.     Cranial Nerves: No cranial nerve deficit.     Motor: No abnormal muscle tone.     Coordination: Coordination normal.     Comments: No ataxia on finger to nose bilaterally. No pronator drift. 5/5 strength throughout. CN 2-12 intact.Equal grip strength. Sensation intact.     Psychiatric:        Behavior: Behavior normal.      ED Treatments / Results  Labs (all labs ordered are listed, but only abnormal results are displayed) Labs Reviewed  BASIC METABOLIC PANEL - Abnormal; Notable for the following components:      Result Value   Potassium 3.3 (*)    Glucose, Bld 114 (*)    All other components within normal limits  HEPATIC FUNCTION PANEL - Abnormal; Notable for the following components:   Indirect Bilirubin 1.0 (*)    All other components within normal limits  CBC  LIPASE, BLOOD  TROPONIN I  BRAIN NATRIURETIC PEPTIDE  URINALYSIS, ROUTINE W REFLEX MICROSCOPIC  CBG MONITORING, ED    EKG EKG Interpretation  Date/Time:  Monday September 05 2018 18:22:55 EST Ventricular Rate:  88 PR Interval:  144 QRS Duration: 82 QT Interval:  358 QTC Calculation: 433 R Axis:   66 Text Interpretation:  Normal sinus rhythm Left ventricular hypertrophy Abnormal ECG No significant change was found Confirmed by Glynn Octave [phone]) on 09/06/2018 4:07:53 AM   Radiology Ct Head Wo Contrast  Result Date: 09/05/2018 CLINICAL DATA:  Headache, acute, severe, worst HA of life. Syncope. EXAM: CT HEAD WITHOUT CONTRAST TECHNIQUE: Contiguous axial images were obtained from the base of the skull through the vertex without intravenous contrast. COMPARISON:  None. FINDINGS: Brain: Brain volume is normal for age. No intracranial hemorrhage, mass effect, or midline shift. No hydrocephalus. The basilar cisterns are patent. No evidence of territorial infarct or acute ischemia. No extra-axial or intracranial fluid collection. Vascular: No hyperdense vessel. Skull: No fracture or focal lesion. Sinuses/Orbits: Mucosal thickening of the ethmoid air cells, sphenoid sinus, right frontal sinus and right maxillary sinus. Included orbits are unremarkable. The mastoid air cells are clear. Other: None. IMPRESSION: 1. No acute intracranial abnormality. 2. Paranasal sinus disease of uncertain  acuity. Electronically Signed   By: Narda Rutherford M.D.   On: 09/05/2018 20:49   US Aorta  Result Date: 09/06/2018 CLINICAL DATA:  Syncope and abdominal pain EXAM: ULTRASOUND OF ABDOMINAL AORTA TECHNIQUE: Ultrasound examination of the abdominal aorta and proximal common iliac arteries was performed to evaluate for aneurysm. Additional color and Doppler images of the distal aorta were obtained to document patency. COMPARISON:  None. FINDINGS: Abdominal aortic measurements as follows: Proximal:  2.5 x 2.5 cm Mid:  2.1 x 2 cm Distal:  1.7 x 1.6 cm Patent: Yes, peak systolic velocity is 113 cm/s Right common iliac artery: 1.2 cm maximal diameter Left common iliac artery: 1.3 cm IMPRESSION: Negative abdominal aortic ultrasound. Electronically Signed   By: Marnee Spring M.D.   On: 09/06/2018 07:32    Procedures Procedures (including critical care time)  Medications Ordered in ED Medications  sodium chloride flush (NS) 0.9 % injection 3 mL (has no administration in time range)  sodium chloride 0.9 % bolus 1,000 mL (has no administration in time range)  ondansetron (ZOFRAN) injection 4 mg (has no administration in time range)     Initial Impression / Assessment and Plan / ED Course  I have reviewed the triage vital signs and the nursing notes.  Pertinent labs & imaging results that were available during my care of the patient were reviewed by me and considered in my medical decision making (see chart for details).    Patient with syncopal episode preceded by dizziness, lightheadedness, nausea, vomiting and diarrhea.  No chest pain or shortness of breath.  EKG shows sinus rhythm and LVH.  No Brugada or prolonged QT.  Abdomen soft without peritoneal signs.  Denies any abdominal pain or chest pain currently.  CT head obtained within 3 hours of headache onset is negative which rules out subarachnoid hemorrhage. Additionally, patient denies having headache before or at the point of syncope.    Patient given IV fluids and symptom control.  Orthostatics positive by heart rate, heart rate 77 lying, 107 standing.  No chest pain or shortness of breath.  EKG without Brugada or prolonged QT.  Labs reassuring with negative troponin >10 hours after event.  Patient feels improved after p.o. and IV fluids Tolerating PO and ambulatory.  [place] negative for AAA.  Suspect vasovagal syncope and orthostasis in setting of GI illness. [place] and [address]  score negative.   Continue oral hydration at home and PCP followup. Cease cocaine use.  Return precautions discussed.  Final Clinical Impressions(s) / ED Diagnoses   Final diagnoses:  Syncope and collapse  Non-intractable vomiting with nausea, unspecified vomiting type    ED Discharge Orders    None       Roan Miklos, Jeannett SeniorStephen, MD 09/06/18 [phone]

## 2018-09-06 NOTE — ED Notes (Signed)
Patient given water to drink. Able to walk with steady gait to the restroom.

## 2018-09-06 NOTE — Discharge Instructions (Addendum)
We suspect you passed out due to dehydration from vomiting and diarrhea.  Your testing does not show any significant pathology.  You should keep yourself hydrated and follow-up with your doctor.  Return to the ED with chest pain, shortness of breath, any other concerns.

## 2018-09-22 ENCOUNTER — Inpatient Hospital Stay: Payer: Self-pay | Admitting: Critical Care Medicine

## 2018-09-22 NOTE — Progress Notes (Deleted)
   Subjective:    Patient ID: [name], male    DOB: 12-21-1955, 63 y.o.   MRN: [id number]  63 y.o. M with ETOH use, Hep C , cirrhosis,  Recent ED visit for syncope 1/27   Patient with syncopal episode preceded by dizziness, lightheadedness, nausea, vomiting and diarrhea.  No chest pain or shortness of breath.  EKG shows sinus rhythm and LVH.  No Brugada or prolonged QT.  Abdomen soft without peritoneal signs.  Denies any abdominal pain or chest pain currently.  CT head obtained within 3 hours of headache onset is negative which rules out subarachnoid hemorrhage. Additionally, patient denies having headache before or at the point of syncope.   Patient given IV fluids and symptom control.  Orthostatics positive by heart rate, heart rate 77 lying, 107 standing.  No chest pain or shortness of breath.  EKG without Brugada or prolonged QT.  Labs reassuring with negative troponin >10 hours after event.  Patient feels improved after p.o. and IV fluids Tolerating PO and ambulatory.  [place] negative for AAA.  Suspect vasovagal syncope and orthostasis in setting of GI illness. [place] and FAINT score negative.   Continue oral hydration at home and PCP followup. Cease cocaine use.  Return precautions discussed.      Review of Systems     Objective:   Physical Exam        Assessment & Plan:

## 2018-10-26 ENCOUNTER — Ambulatory Visit (INDEPENDENT_AMBULATORY_CARE_PROVIDER_SITE_OTHER): Payer: Medicaid Other | Admitting: Pharmacist

## 2018-10-26 ENCOUNTER — Ambulatory Visit (INDEPENDENT_AMBULATORY_CARE_PROVIDER_SITE_OTHER): Payer: Medicaid Other | Admitting: Licensed Clinical Social Worker

## 2018-10-26 ENCOUNTER — Other Ambulatory Visit: Payer: Self-pay

## 2018-10-26 DIAGNOSIS — F1010 Alcohol abuse, uncomplicated: Secondary | ICD-10-CM

## 2018-10-26 DIAGNOSIS — B182 Chronic viral hepatitis C: Secondary | ICD-10-CM

## 2018-10-26 NOTE — Progress Notes (Signed)
HPI: [name] is a 63 y.o. male who presents to the [facility] pharmacy clinic for Hepatitis C follow-up.  Medication: Mavyret x 8 weeks  Start Date: 05/23/18  Hepatitis C Genotype: 1a  Fibrosis Score: F0/F1  Hepatitis C RNA: 2.8 mil on 9/25, <15 on 11/6 and 12/11  Patient Active Problem List   Diagnosis Date Noted  . Liver fibrosis 05/04/2018  . Vaccine counseling 05/04/2018  . Chronic hepatitis C without hepatic coma (HCC) 04/13/2018  . Closed displaced trimalleolar fracture of left ankle 10/16/2017  . Ankle dislocation, left, initial encounter 10/14/2017    Patient's Medications  New Prescriptions   No medications on file  Previous Medications   GLECAPREVIR-PIBRENTASVIR (MAVYRET) 100-40 MG TABS    Take 3 tablets by mouth daily with breakfast.   ONDANSETRON (ZOFRAN ODT) 4 MG DISINTEGRATING TABLET    Take 1 tablet (4 mg total) by mouth every 8 (eight) hours as needed for nausea or vomiting.  Modified Medications   No medications on file  Discontinued Medications   No medications on file    Allergies: No Known Allergies  Past Medical History: Past Medical History:  Diagnosis Date  . History of rib fracture     Social History: Social History   Socioeconomic History  . Marital status: Single    Spouse name: Not on file  . Number of children: Not on file  . Years of education: Not on file  . Highest education level: Not on file  Occupational History  . Not on file  Social Needs  . Financial resource strain: Not on file  . Food insecurity:    Worry: Not on file    Inability: Not on file  . Transportation needs:    Medical: Not on file    Non-medical: Not on file  Tobacco Use  . Smoking status: Never Smoker  . Smokeless tobacco: Never Used  Substance and Sexual Activity  . Alcohol use: Yes    Alcohol/week: 42.0 standard drinks    Types: 42 Cans of beer per week    Comment: everyday   . Drug use: Yes    Types: "Crack" cocaine    Comment: every  once in a while (twice a month)  . Sexual activity: Yes    Partners: Female  Lifestyle  . Physical activity:    Days per week: Not on file    Minutes per session: Not on file  . Stress: Not on file  Relationships  . Social connections:    Talks on phone: Not on file    Gets together: Not on file    Attends religious service: Not on file    Active member of club or organization: Not on file    Attends meetings of clubs or organizations: Not on file    Relationship status: Not on file  Other Topics Concern  . Not on file  Social History Narrative  . Not on file    Labs: Hepatitis C Lab Results  Component Value Date   HCVGENOTYPE 1a 05/04/2018   HCVRNAPCRQN <15 NOT DETECTED 07/20/2018   HCVRNAPCRQN <15 NOT DETECTED 06/15/2018   HCVRNAPCRQN 2,880,000 (H) 05/04/2018   Hepatitis B Lab Results  Component Value Date   HEPBSAB NON-REACTIVE 04/13/2018   HEPBSAG NON-REACTIVE 04/13/2018   Hepatitis A Lab Results  Component Value Date   HAV NON-REACTIVE 04/13/2018   HIV Lab Results  Component Value Date   HIV NON-REACTIVE 04/13/2018   HIV Non Reactive 10/15/2017   HIV NONREACTIVE  07/06/2016   Lab Results  Component Value Date   CREATININE 0.98 09/05/2018   CREATININE 0.87 04/13/2018   CREATININE 1.03 10/14/2017   CREATININE 0.95 07/06/2016   Lab Results  Component Value Date   AST 21 09/06/2018   AST 33 04/13/2018   AST 29 10/14/2017   ALT 20 09/06/2018   ALT 26 04/13/2018   ALT 27 10/14/2017   INR 1.0 04/13/2018   INR 0.99 10/14/2017    Assessment: [name] is here today to follow-up for his chronic Hepatitis C infection. He completed 8 weeks of Mavyet back in December with no issues and no missed doses.  His early on treatment and end of treatment Hep C viral load were both undetectable.  Will check again today for cure.  Counseled on the risk of reinfection.    Plan: - Hep C RNA today for cure   L. , PharmD, BCIDP, AAHIVP, CPP Infectious  Diseases Clinical Pharmacist [facility] 10/26/2018, 2:27 PM

## 2018-10-26 NOTE — BH Specialist Note (Signed)
Integrated Behavioral Health Follow Up Visit  MRN: [id number] Name: [name]  Session Start time: 10:20am  Session End time: 10:50am Total time: 30 minutes  Type of Service: Integrated Behavioral Health- Individual/Family Interpretor:No. Interpretor Name and Language: n/a  SUBJECTIVE: [name] is a 63 y.o. 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 Currently he is trying to get hired full time by [employer]. Patient also has 2 applications in for low-income housing and states that a caseworker is helping him with these.   GOALS ADDRESSED: Patient will: 1.  Reduce symptoms of: substance abuse    INTERVENTIONS: Interventions utilized:  Solution-Focused Strategies and Supportive Counseling  ASSESSMENT: Patient currently experiencing frustration and stress about life situations. He reports that he is not drinking nearly as much as he used to, but will drink one to two cans of beer each week. Counselor pointed out that even this amount can damage his liver further and interact with treatment for Hepatitis. Patient stated that he understands that. Counselor guided patient to identify why he has those 1-2 beers when he knows that it is harmful for him. Patient reported that his environment is one that supports drinking, and that the people around him drink heavily. He expresses belief that finding a place to live will fix this problem for him. Patient and counselor  discussed obstacles to finding work and housing, and explored ways to cope with them. Counselor emphasized the importance of self-care, including food and water, not drinking alcohol, spirituality, seeking out opportunities for positive interactions.  Patient may benefit from continuing to check in on sobriety. He reports that he has begun seeing a Veterinary surgeon, Mr. Zachery Dauer, at [facility]. He is able to see him more often and does not have to have transportation to get to him. Patient will continue his counseling with Mr. Zachery Dauer, but verbalizes understanding that he can call this counselor if needed.   PLAN: 1. Follow up with counselor at [facility]. 2. Behavioral Recommendations: abstain from all alcohol.  Angus Palms, LCSW

## 2018-10-29 LAB — HEPATITIS C RNA QUANTITATIVE
HCV Quantitative Log: <1.18 Log IU/mL
HCV RNA, PCR, QN: <15 IU/mL

## 2018-12-23 ENCOUNTER — Encounter (HOSPITAL_COMMUNITY): Payer: Self-pay

## 2018-12-23 ENCOUNTER — Emergency Department (HOSPITAL_COMMUNITY): Payer: Medicaid Other

## 2018-12-23 ENCOUNTER — Emergency Department (HOSPITAL_COMMUNITY)
Admission: EM | Admit: 2018-12-23 | Discharge: 2018-12-23 | Disposition: A | Discharge status: Home / Self Care | Payer: Medicaid Other | Attending: Emergency Medicine | Admitting: Emergency Medicine

## 2018-12-23 ENCOUNTER — Other Ambulatory Visit: Payer: Self-pay

## 2018-12-23 DIAGNOSIS — Y929 Unspecified place or not applicable: Secondary | ICD-10-CM | POA: Diagnosis not present

## 2018-12-23 DIAGNOSIS — B182 Chronic viral hepatitis C: Secondary | ICD-10-CM | POA: Insufficient documentation

## 2018-12-23 DIAGNOSIS — F1490 Cocaine use, unspecified, uncomplicated: Secondary | ICD-10-CM | POA: Diagnosis not present

## 2018-12-23 DIAGNOSIS — Y999 Unspecified external cause status: Secondary | ICD-10-CM | POA: Diagnosis not present

## 2018-12-23 DIAGNOSIS — M25561 Pain in right knee: Secondary | ICD-10-CM | POA: Insufficient documentation

## 2018-12-23 DIAGNOSIS — Y939 Activity, unspecified: Secondary | ICD-10-CM | POA: Insufficient documentation

## 2018-12-23 DIAGNOSIS — S8991XA Unspecified injury of right lower leg, initial encounter: Secondary | ICD-10-CM | POA: Diagnosis present

## 2018-12-23 HISTORY — DX: Unspecified viral hepatitis C without hepatic coma: B19.20

## 2018-12-23 MED ORDER — IBUPROFEN 200 MG PO TABS: 400.0000 mg | ORAL_TABLET | Freq: Four times a day (QID) | ORAL | 0 refills | Status: DC | PRN

## 2018-12-23 NOTE — ED Notes (Signed)
Pt returned to room from Xray. 

## 2018-12-23 NOTE — ED Notes (Signed)
Patient transported to X-ray 

## 2018-12-23 NOTE — ED Notes (Signed)
ED Provider at bedside. 

## 2018-12-23 NOTE — ED Notes (Signed)
Pt demonstrated understanding of how to use crutches and ambulated well.

## 2018-12-23 NOTE — ED Notes (Signed)
Patient verbalizes understanding of discharge instructions. Opportunity for questioning and answers were provided. Armband removed by staff, pt discharged from ED.  

## 2018-12-23 NOTE — ED Notes (Signed)
Spoke to [facility]; advised she is on the way down to see patient to assist with crutches/knee sleeve.
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
## 2018-12-23 NOTE — ED Notes (Signed)
Ortho tech at pt bedside 

## 2018-12-23 NOTE — Discharge Instructions (Addendum)
Your x-rays today showed no sign of fracture or dislocation.  1. Medications: Take 400-600 mg of ibuprofen every 6 hours as needed for pain. Take ibuprofen with food to avoid upset stomach issues.  2. Treatment: rest, ice, elevate and use brace, drink plenty of fluids, gentle stretching (see attached exercises).  3. Follow Up: Please followup with orthopedics as directed or your PCP in 1 week if no improvement for discussion of your diagnoses and further evaluation after today's visit; if you do not have a primary care doctor use the resource guide provided to find one; Please return to the ER for worsening symptoms or other concerns such as worsening swelling, redness of the skin, fevers, loss of pulses, or loss of feeling

## 2018-12-23 NOTE — Progress Notes (Signed)
Orthopedic Tech Progress Note Patient Details:  [name] 13-Jun-1956 [id number] I applied knee sleeve and had the patient try out the crutches. For some Reason a knee sleeve wouldn't pull up.  Ortho Devices Type of Ortho Device: Crutches, Knee Sleeve Ortho Device/Splint Location: LRE Ortho Device/Splint Interventions: Adjustment, Application, Ordered   Post Interventions Patient Tolerated: Well Instructions Provided: Poper ambulation with device, Care of device, Adjustment of device   [name] 12/23/2018, 9:51 AM

## 2019-07-17 ENCOUNTER — Other Ambulatory Visit: Payer: Self-pay

## 2019-07-17 ENCOUNTER — Encounter (HOSPITAL_COMMUNITY): Payer: Self-pay | Admitting: Emergency Medicine

## 2019-07-17 ENCOUNTER — Emergency Department (HOSPITAL_COMMUNITY)
Admission: EM | Admit: 2019-07-17 | Discharge: 2019-07-17 | Disposition: A | Discharge status: Home / Self Care | Payer: Medicaid Other | Attending: Emergency Medicine | Admitting: Emergency Medicine

## 2019-07-17 DIAGNOSIS — K0889 Other specified disorders of teeth and supporting structures: Secondary | ICD-10-CM | POA: Diagnosis present

## 2019-07-17 DIAGNOSIS — Z79899 Other long term (current) drug therapy: Secondary | ICD-10-CM | POA: Insufficient documentation

## 2019-07-17 MED ORDER — PENICILLIN V POTASSIUM 250 MG PO TABS
500.0000 mg | ORAL_TABLET | Freq: Once | ORAL | Status: AC
  Administered 2019-07-17: 15:00:00 500 mg via ORAL
  Filled 2019-07-17: qty 2

## 2019-07-17 MED ORDER — PENICILLIN V POTASSIUM 500 MG PO TABS: 500.0000 mg | ORAL_TABLET | Freq: Four times a day (QID) | ORAL | 0 refills | Status: AC

## 2019-07-17 MED ORDER — IBUPROFEN 400 MG PO TABS: 400.0000 mg | ORAL_TABLET | Freq: Four times a day (QID) | ORAL | 0 refills | Status: DC | PRN

## 2019-07-17 MED FILL — PENICILLIN VK 500 MG TABLET: 500 | 7 days supply | Qty: 28 | Fill #0

## 2019-07-17 NOTE — Discharge Instructions (Addendum)
Please take entire course of antibiotics as directed.  You can use ibuprofen 400 mg every 6 hours as needed for pain, avoid Tylenol given your history of hepatitis.  You can also use Orajel.  You will need to follow-up with your dentist for continued management of this.  Return to the emergency department for fevers, swelling or pain under the tongue or in the neck, difficulty breathing or swallowing or any other new or concerning symptoms.

## 2019-07-17 NOTE — ED Notes (Signed)
Patient verbalizes understanding of discharge instructions. Opportunity for questioning and answers were provided. Armband removed by staff, pt discharged from ED.  

## 2019-07-17 NOTE — ED Triage Notes (Signed)
Pt reports dental pain for over 2 weeks and has seen a dentist who states he is unable to pull these teeth at this time. Pt here today due to increase pain

## 2019-07-17 NOTE — ED Provider Notes (Signed)
[facility] EMERGENCY DEPARTMENT Provider Note   CSN: [id number] Arrival date & time: 07/17/19  1251     History   Chief Complaint Chief Complaint  Patient presents with  . Dental Pain    HPI [name] is a 63 y.o. male.     [name] is a 63 y.o. male with a history of hepatitis C, who presents to the ED for evaluation of dental pain over the past 2 weeks.  He reports he has multiple fillings and he has pain primarily over his right upper molars as well as one of his lower teeth.  He has not noticed any swelling or drainage.  Reports he has been to see 2 different dental walk-in clinics but they told him they could not pull the teeth, and he has not been put on any antibiotics or other medications.  He has not had any fevers or chills.  No vomiting.  Reports chewing is painful.  He denies any pain or swelling underneath the tongue or any neck pain or swelling.  No medications for pain prior to arrival.  No other aggravating or alleviating factors.     Past Medical History:  Diagnosis Date  . Hepatitis C   . History of rib fracture     Patient Active Problem List   Diagnosis Date Noted  . Liver fibrosis 05/04/2018  . Vaccine counseling 05/04/2018  . Chronic hepatitis C without hepatic coma ([place]) 04/13/2018  . Closed displaced trimalleolar fracture of left ankle 10/16/2017  . Ankle dislocation, left, initial encounter 10/14/2017    Past Surgical History:  Procedure Laterality Date  . ORIF ANKLE FRACTURE Left 10/15/2017   Procedure: OPEN REDUCTION INTERNAL FIXATION (ORIF) ANKLE FRACTURE;  Surgeon: Shona Needles, MD;  Location: [place] [facility];  Service: Orthopedics;  Laterality: Left;  . ORIF ANKLE FRACTURE Left 10/18/2017   Procedure: OPEN REDUCTION INTERNAL FIXATION (ORIF) ANKLE FRACTURE;  Surgeon: Shona Needles, MD;  Location: [place];  Service: Orthopedics;  Laterality: Left;        Home Medications    Prior to Admission medications   Medication  Sig Start Date End Date Taking? Authorizing Provider  Glecaprevir-Pibrentasvir (MAVYRET) 100-40 MG TABS Take 3 tablets by mouth daily with breakfast. Patient not taking: Reported on 09/06/2018 05/16/18   Kuppelweiser, Cassie L, RPH-CPP  ibuprofen (ADVIL) 400 MG tablet Take 1 tablet (400 mg total) by mouth every 6 (six) hours as needed. 07/17/19   Jacqlyn Larsen, PA-C  ondansetron (ZOFRAN ODT) 4 MG disintegrating tablet Take 1 tablet (4 mg total) by mouth every 8 (eight) hours as needed for nausea or vomiting. 09/06/18   Rancour, Annie Main, MD  penicillin v potassium (VEETID) 500 MG tablet Take 1 tablet (500 mg total) by mouth 4 (four) times daily for 7 days. 07/17/19 07/24/19  Jacqlyn Larsen, PA-C    Family History Family History  Problem Relation Age of Onset  . Diabetes Neg Hx   . Hypertension Neg Hx     Social History Social History   Tobacco Use  . Smoking status: Never Smoker  . Smokeless tobacco: Never Used  Substance Use Topics  . Alcohol use: Yes    Alcohol/week: 42.0 standard drinks    Types: 42 Cans of beer per week    Comment: pt reports drinking 6 pk everyday  . Drug use: Yes    Types: "Crack" cocaine    Comment: once a month     Allergies   Patient has  no known allergies.   Review of Systems Review of Systems  Constitutional: Negative for chills and fever.  HENT: Positive for dental problem. Negative for facial swelling.   Gastrointestinal: Negative for nausea and vomiting.  Skin: Negative for color change.     Physical Exam Updated Vital Signs BP (!) 152/99 (BP Location: Left Arm)   Pulse 89   Temp 98.3 F (36.8 C) (Oral)   Resp 19   Ht 5\' 5"  (1.651 m)   Wt 68 kg   SpO2 99%   BMI 24.96 kg/m   Physical Exam Vitals signs and nursing note reviewed.  Constitutional:      General: He is not in acute distress.    Appearance: Normal appearance. He is well-developed. He is not diaphoretic.  HENT:     Head: Normocephalic and atraumatic.     Mouth/Throat:      Comments: Multiple teeth and decay with numerous fillings noted, pain with palpation over the right upper molars, no evident dental abscess, posterior oropharynx clear, no sublingual tenderness or induration.  No trismus, tolerating secretions, normal phonation. Eyes:     General:        Right eye: No discharge.        Left eye: No discharge.  Neck:     Musculoskeletal: Neck supple. No muscular tenderness.     Comments: No torticollis Pulmonary:     Effort: Pulmonary effort is normal. No respiratory distress.  Skin:    General: Skin is warm and dry.  Neurological:     Mental Status: He is alert and oriented to person, place, and time.     Coordination: Coordination normal.  Psychiatric:        Mood and Affect: Mood normal.        Behavior: Behavior normal.      ED Treatments / Results  Labs (all labs ordered are listed, but only abnormal results are displayed) Labs Reviewed - No data to display  EKG None  Radiology No results found.  Procedures Procedures (including critical care time)  Medications Ordered in ED Medications  penicillin v potassium (VEETID) tablet 500 mg (has no administration in time range)     Initial Impression / Assessment and Plan / ED Course  I have reviewed the triage vital signs and the nursing notes.  Pertinent labs & imaging results that were available during my care of the patient were reviewed by me and considered in my medical decision making (see chart for details).  Patient with toothache.  No gross abscess.  Exam unconcerning for Ludwig's angina or spread of infection.  Will treat with penicillin and anti-inflammatories medicine.  Urged patient to follow-up with dentist.  Given information for on-call dentist as well as dental resource packet.  Final Clinical Impressions(s) / ED Diagnoses   Final diagnoses:  Tooth pain    ED Discharge Orders         Ordered    penicillin v potassium (VEETID) 500 MG tablet  4 times daily      07/17/19 1435    ibuprofen (ADVIL) 400 MG tablet  Every 6 hours PRN     07/17/19 1435           14/07/20, PA-C 07/17/19 1440    14/07/20, DO 07/17/19 1535

## 2019-09-01 IMAGING — CT CT HEAD W/O CM
4 of 6 series · 17 of 47 positions shown, 19 images · non-contrast
Comparison: None.

CLINICAL DATA: Headache, acute, severe, worst HA of life. Syncope.

EXAM:
CT HEAD WITHOUT CONTRAST
TECHNIQUE: Contiguous axial images were obtained from the base of the skull
through the vertex without intravenous contrast.

[Series 3: head bone · axial · 0.46mm/px · z∈[+1149,+1249]mm · 6 of 84 slices shown]
[im 6/84  bone]
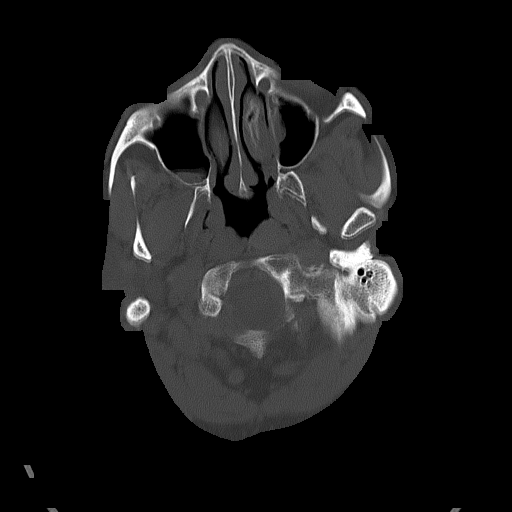
[im 17/84  bone]
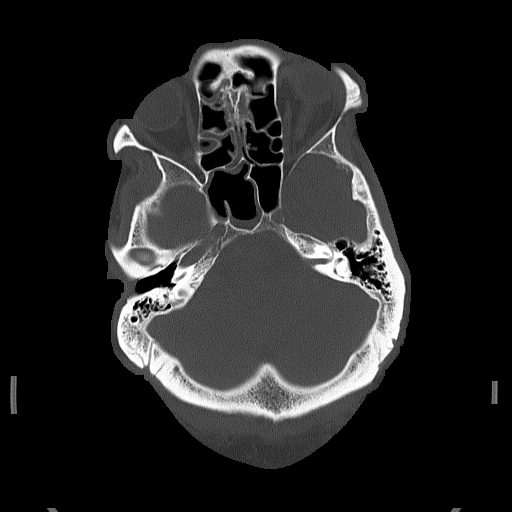
[im 28/84  bone]
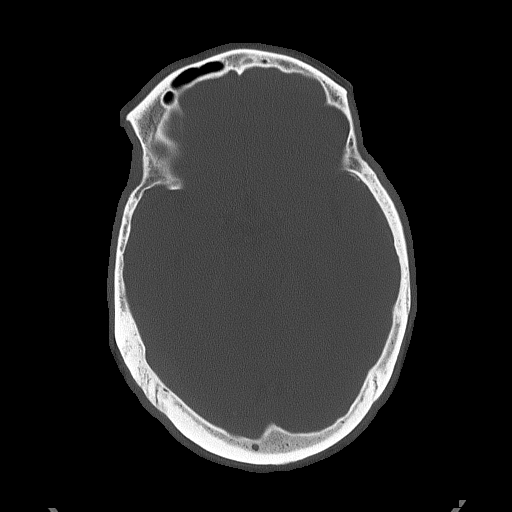
[im 39/84  bone]
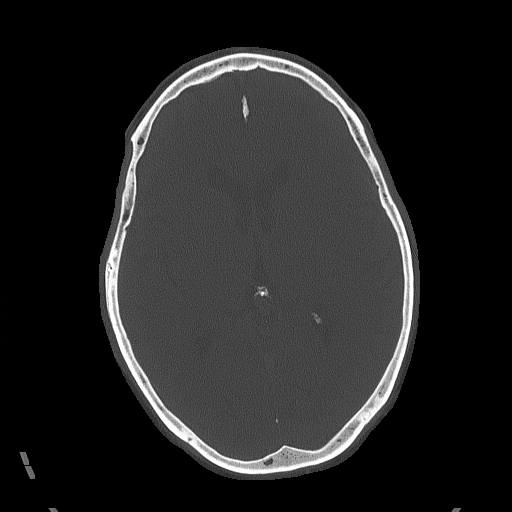
[im 45/84  bone]
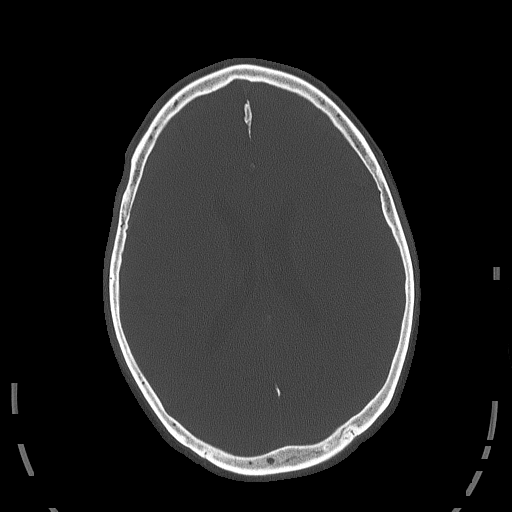
[im 56/84  bone]
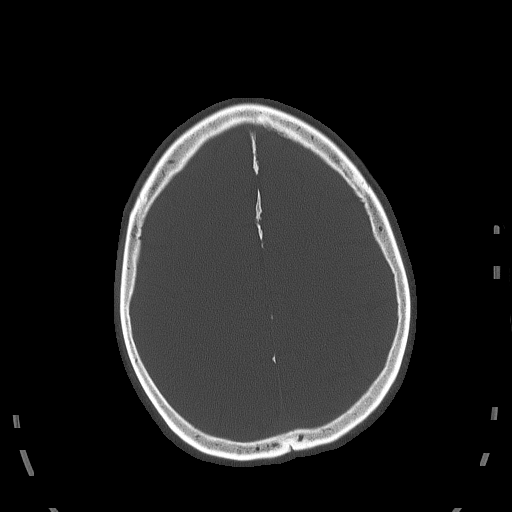

[Series 4: head without · axial · non-contrast · 0.46mm/px · z∈[+1164,+1274]mm · 5 of 34 slices shown, 7 images]
[im 6/34  brain]
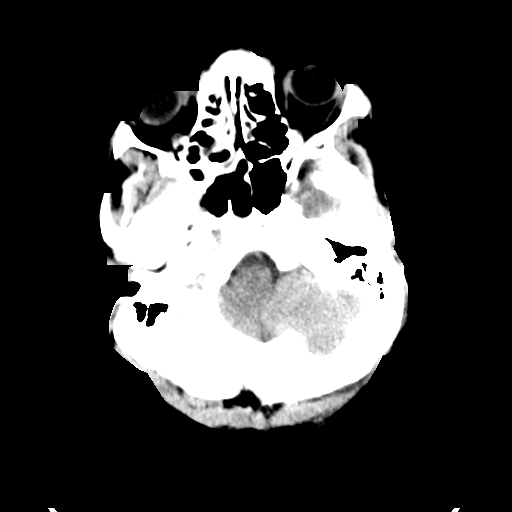
[im 6/34  bone]
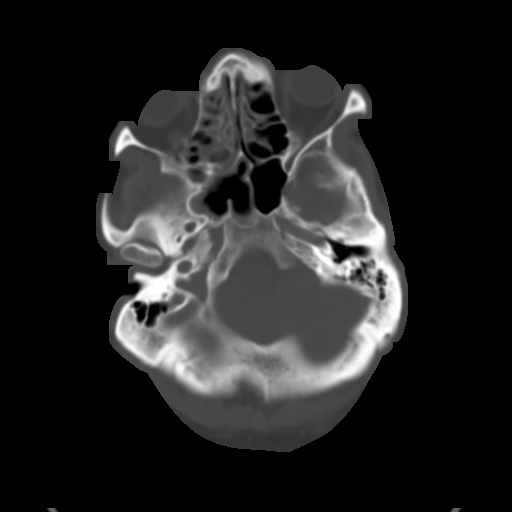
[im 12/34  brain]
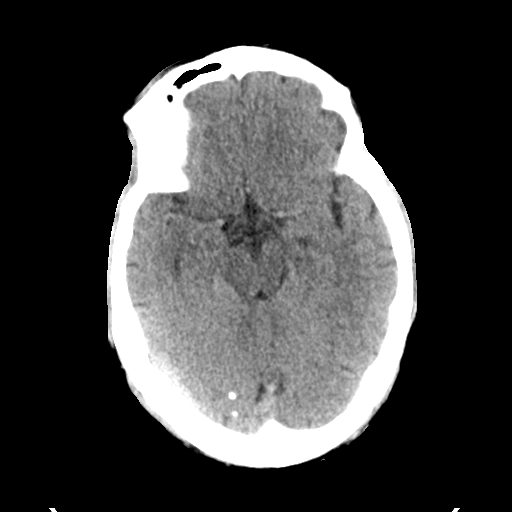
[im 17/34  brain]
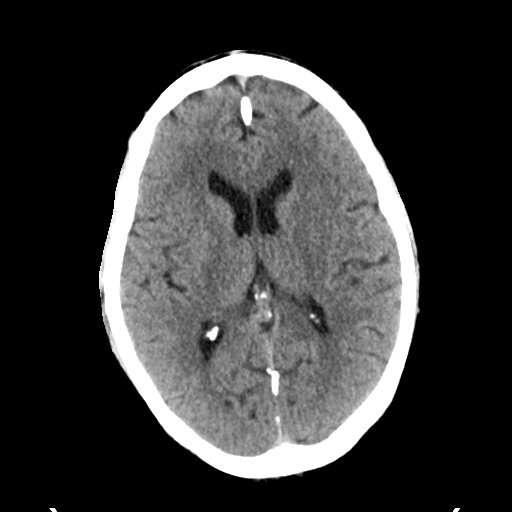
[im 23/34  brain]
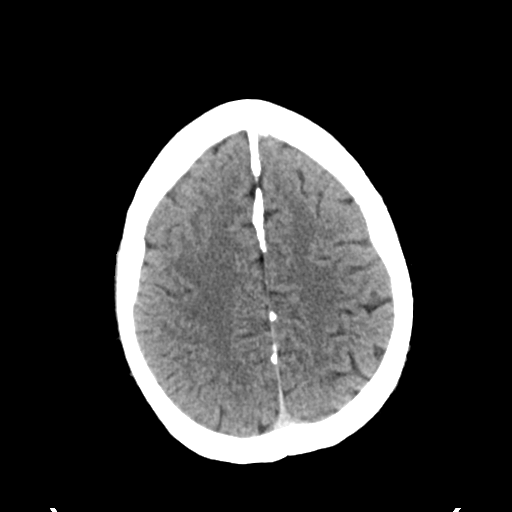
[im 28/34  brain]
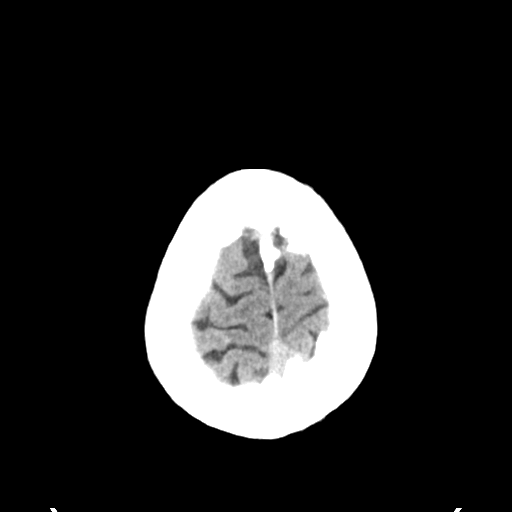
[im 28/34  bone]
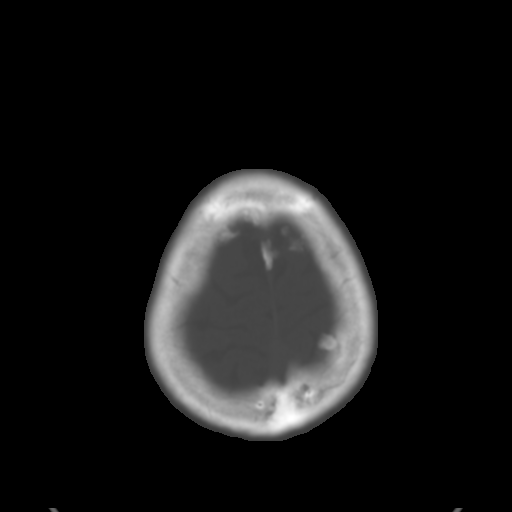

[Series 7: head without cor · coronal · non-contrast · 0.32mm/px · 3 of 69 slices shown]
[im 23/69  brain]
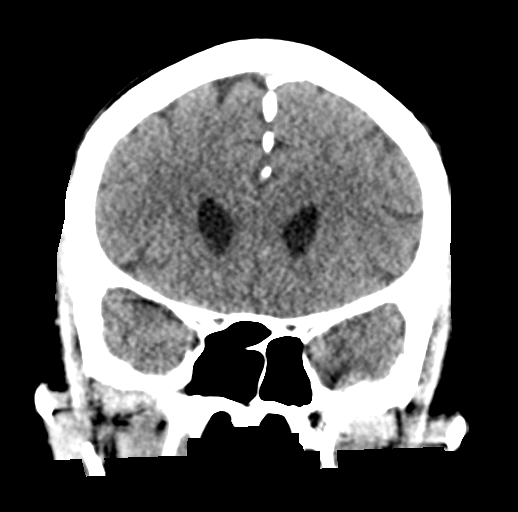
[im 31/69  brain]
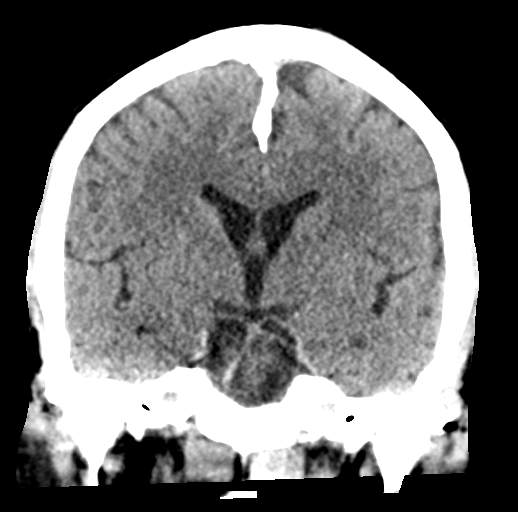
[im 38/69  brain]
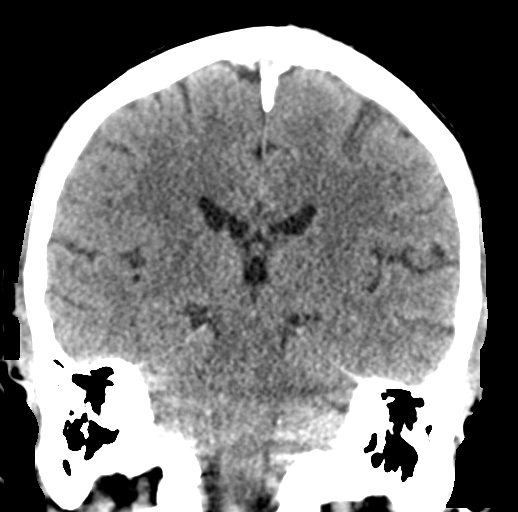

[Series 8: head without sag · sagittal · non-contrast · 0.32mm/px · 3 of 56 slices shown]
[im 19/56  brain]
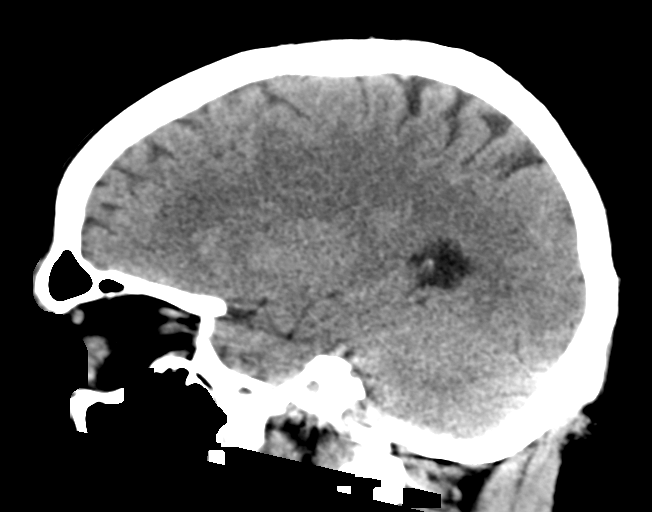
[im 28/56  brain]
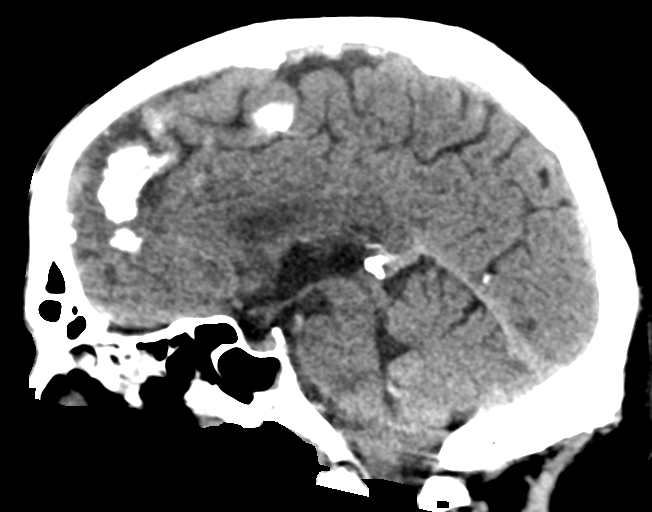
[im 37/56  brain]
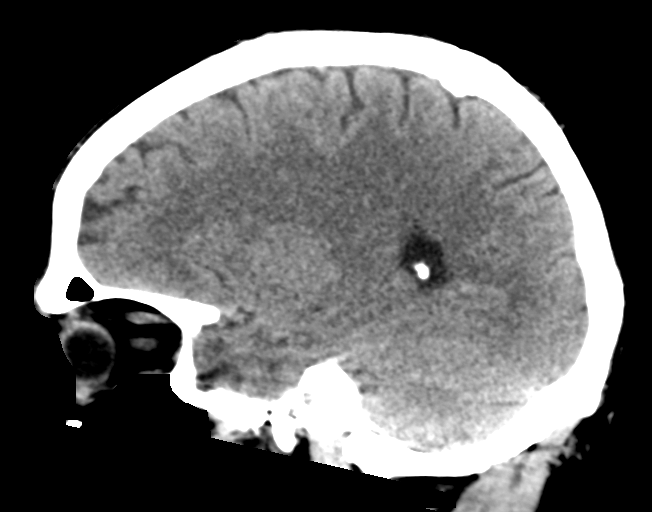

[17 of 47 positions shown; findings below may reference images not displayed]

FINDINGS: Brain: Brain volume is normal for age. No intracranial hemorrhage,
mass effect, or midline shift. No hydrocephalus. The basilar
cisterns are patent. No evidence of territorial infarct or acute
ischemia. No extra-axial or intracranial fluid collection.

Vascular: No hyperdense vessel.

Skull: No fracture or focal lesion.

Sinuses/Orbits: Mucosal thickening of the ethmoid air cells,
sphenoid sinus, right frontal sinus and right maxillary sinus.
Included orbits are unremarkable. The mastoid air cells are clear.

Other: None.
IMPRESSION: 1. No acute intracranial abnormality.
2. Paranasal sinus disease of uncertain acuity.

## 2019-09-22 ENCOUNTER — Emergency Department (HOSPITAL_COMMUNITY): Payer: Medicaid Other

## 2019-09-22 ENCOUNTER — Other Ambulatory Visit: Payer: Self-pay

## 2019-09-22 ENCOUNTER — Emergency Department (HOSPITAL_COMMUNITY)
Admission: EM | Admit: 2019-09-22 | Discharge: 2019-09-22 | Disposition: A | Discharge status: Home / Self Care | Payer: Medicaid Other | Attending: Emergency Medicine | Admitting: Emergency Medicine

## 2019-09-22 ENCOUNTER — Other Ambulatory Visit: Payer: Self-pay | Admitting: *Deleted

## 2019-09-22 DIAGNOSIS — M25561 Pain in right knee: Secondary | ICD-10-CM | POA: Diagnosis present

## 2019-09-22 DIAGNOSIS — Z20822 Contact with and (suspected) exposure to covid-19: Secondary | ICD-10-CM

## 2019-09-22 MED ORDER — MELOXICAM 7.5 MG PO TABS: 7.5000 mg | ORAL_TABLET | Freq: Every day | ORAL | 0 refills | Status: AC

## 2019-09-22 MED ORDER — ACETAMINOPHEN 500 MG PO TABS
1000.0000 mg | ORAL_TABLET | Freq: Once | ORAL | Status: AC
  Administered 2019-09-22: 1000 mg via ORAL
  Filled 2019-09-22: qty 2

## 2019-09-22 MED FILL — MELOXICAM 7.5 MG TABLET: 7.5 | 5 days supply | Qty: 5 | Fill #0

## 2019-09-22 NOTE — ED Triage Notes (Signed)
Patient BIB [facility] from [facility]. Patients c/o right lower leg pain from old injury. Patient had injury from playing sports. Patient states he hit leg two days ago at work and has been throbbing/shooting pain since.

## 2019-09-22 NOTE — ED Provider Notes (Signed)
[facility]-EMERGENCY DEPT Provider Note   CSN: [id number] Arrival date & time: 09/22/19  1322     History Chief Complaint  Patient presents with  . Leg Pain    [name] is a 64 y.o. male brought in by EMS from [employer] who presents for evaluation of right knee pain that has been ongoing for the last 2 days.  Patient reports he has a history of issues with his right knee.  He states that he had an old injury several years ago and since then, he will have occasional flareups.  He reports about 2 days ago, he hit it on something and states that since then, he has had pain.  He states that the pain is gotten progressively worse. He reports difficulty ambulating secondary to pain.  He states it has been slightly swollen but no redness, warmth.  No fevers.  No numbness/weakness.  He has been taking ibuprofen with no improvement in symptoms.  He states he has not followed up with anybody regarding this leg.   The history is provided by the patient.       Past Medical History:  Diagnosis Date  . Hepatitis C   . History of rib fracture     Patient Active Problem List   Diagnosis Date Noted  . Liver fibrosis 05/04/2018  . Vaccine counseling 05/04/2018  . Chronic hepatitis C without hepatic coma (HCC) 04/13/2018  . Closed displaced trimalleolar fracture of left ankle 10/16/2017  . Ankle dislocation, left, initial encounter 10/14/2017    Past Surgical History:  Procedure Laterality Date  . ORIF ANKLE FRACTURE Left 10/15/2017   Procedure: OPEN REDUCTION INTERNAL FIXATION (ORIF) ANKLE FRACTURE;  Surgeon: Roby Lofts, MD;  Location: [facility] OR;  Service: Orthopedics;  Laterality: Left;  . ORIF ANKLE FRACTURE Left 10/18/2017   Procedure: OPEN REDUCTION INTERNAL FIXATION (ORIF) ANKLE FRACTURE;  Surgeon: Roby Lofts, MD;  Location: [facility] OR;  Service: Orthopedics;  Laterality: Left;       Family History  Problem Relation Age of Onset  . Diabetes Neg Hx     . Hypertension Neg Hx     Social History   Tobacco Use  . Smoking status: Never Smoker  . Smokeless tobacco: Never Used  Substance Use Topics  . Alcohol use: Yes    Alcohol/week: 42.0 standard drinks    Types: 42 Cans of beer per week    Comment: pt reports drinking 6 pk everyday  . Drug use: Yes    Types: "Crack" cocaine    Comment: once a month    Home Medications Prior to Admission medications   Medication Sig Start Date End Date Taking? Authorizing Provider  Glecaprevir-Pibrentasvir (MAVYRET) 100-40 MG TABS Take 3 tablets by mouth daily with breakfast. Patient not taking: Reported on 09/06/2018 05/16/18   Kuppelweiser, Cassie L, RPH-CPP  ibuprofen (ADVIL) 400 MG tablet Take 1 tablet (400 mg total) by mouth every 6 (six) hours as needed. 07/17/19   Dartha Lodge, PA-C  meloxicam (MOBIC) 7.5 MG tablet Take 1 tablet (7.5 mg total) by mouth daily for 5 days. 09/22/19 09/27/19  Maxwell Caul, PA-C  ondansetron (ZOFRAN ODT) 4 MG disintegrating tablet Take 1 tablet (4 mg total) by mouth every 8 (eight) hours as needed for nausea or vomiting. 09/06/18   Rancour, Jeannett Senior, MD    Allergies    Patient has no known allergies.  Review of Systems   Review of Systems  Constitutional: Negative for fever.  Musculoskeletal:       Right knee pain  Skin: Negative for color change.  Neurological: Negative for weakness and numbness.  All other systems reviewed and are negative.   Physical Exam Updated Vital Signs BP (!) 149/122 (BP Location: Left Arm)   Pulse (!) 101   Temp 98.3 F (36.8 C) (Oral)   Resp 17   Ht 5\' 4"  (1.626 m)   Wt 77.1 kg   SpO2 99%   BMI 29.18 kg/m   Physical Exam Vitals and nursing note reviewed.  Constitutional:      Appearance: He is well-developed.  HENT:     Head: Normocephalic and atraumatic.  Eyes:     General: No scleral icterus.       Right eye: No discharge.        Left eye: No discharge.     Conjunctiva/sclera: Conjunctivae normal.   Cardiovascular:     Pulses:          Dorsalis pedis pulses are 2+ on the right side and 2+ on the left side.  Pulmonary:     Effort: Pulmonary effort is normal.  Musculoskeletal:     Comments: Tenderness palpation noted to anterior aspect of the right knee with overlying very mild soft tissue swelling.  No deformity or crepitus noted.  No overlying warmth, erythema.  Limited extension secondary to pain.  Flexion intact.  No bony tenderness noted to right hip, right femur, right tib-fib, right ankle.  No calf tenderness noted to palpation.  No tenderness palpation in the left lower extremity.  Skin:    General: Skin is warm and dry.  Neurological:     Mental Status: He is alert.     Comments: Sensation intact along major nerve distributions of BLE  Psychiatric:        Speech: Speech normal.        Behavior: Behavior normal.     ED Results / Procedures / Treatments   Labs (all labs ordered are listed, but only abnormal results are displayed) Labs Reviewed - No data to display  EKG None  Radiology DG Knee Complete 4 Views Right  Result Date: 09/22/2019 CLINICAL DATA:  64 year old male with knee pain status post blunt trauma at work 2 days ago. Prior right lower leg sports injury. EXAM: RIGHT KNEE - COMPLETE 4+ VIEW COMPARISON:  Knee series 12/23/2018. FINDINGS: Stable joint spaces and alignment, normal for age. The lateral view is oblique, no definite joint effusion. No acute osseous abnormality identified. No discrete soft tissue injury identified. IMPRESSION: Stable since 2020 and negative for age. Electronically Signed   By: Genevie Ann M.D.   On: 09/22/2019 14:41    Procedures Procedures (including critical care time)  Medications Ordered in ED Medications  acetaminophen (TYLENOL) tablet 1,000 mg (1,000 mg Oral Given 09/22/19 1608)    ED Course  I have reviewed the triage vital signs and the nursing notes.  Pertinent labs & imaging results that were available during my care  of the patient were reviewed by me and considered in my medical decision making (see chart for details).    MDM Rules/Calculators/A&P                      64 year old male who presents for evaluation of right knee pain.  He states that he has issues with his right knee and has had a pre-existing injury.  He states that occasionally, it will flareup.  He reports that a few days  ago, he hit it on something and since then has had pain to the knee.  Also reports some associated swelling but no overlying warmth, erythema.  Denies any fevers, numbness/weakness.  Reports difficulty walking secondary to pain.  On initial driver, he is afebrile, nontoxic-appearing.  Vital signs are stable.  He is neurovascularly intact.  He has some tenderness palpation of the anterior aspect of the right knee with some mild overlying soft tissue swelling.  No warmth, erythema.  Consider contusion versus sprain versus chronic knee pain.  History/physical exam not concerning for ischemic limb, septic arthritis.  We will plan to check x-ray.  Patient given Tylenol.  Reviewed.  Negative for any acute abnormality.  Stable since 2020.  Discussed results with patient.  We will plan for knee sleeve, crutches for supportive care.  Will give an outpatient orthopedic referral. At this time, patient exhibits no emergent life-threatening condition that require further evaluation in ED or admission. Patient had ample opportunity for questions and discussion. All patient's questions were answered with full understanding. Strict return precautions discussed. Patient expresses understanding and agreement to plan.   Portions of this note were generated with Scientist, clinical (histocompatibility and immunogenetics). Dictation errors may occur despite best attempts at proofreading.   Final Clinical Impression(s) / ED Diagnoses Final diagnoses:  Acute pain of right knee    Rx / DC Orders ED Discharge Orders         Ordered    meloxicam (MOBIC) 7.5 MG tablet  Daily      09/22/19 1541           Rosana Hoes 09/22/19 1654    Pricilla Loveless, MD 09/23/19 [phone]

## 2019-09-22 NOTE — Discharge Instructions (Signed)
Take Mobic as directed.   Use knee brace and crutches as directed.   Follow the RICE (Rest, Ice, Compression, Elevation) protocol as directed.   Return emergency department any worsening knee pain, redness or swelling, fevers or any other worsening concerning symptoms.

## 2019-09-24 LAB — NOVEL CORONAVIRUS, NAA: SARS-CoV-2, NAA: NOT DETECTED

## 2019-09-27 ENCOUNTER — Telehealth: Payer: Self-pay | Admitting: *Deleted

## 2019-09-27 ENCOUNTER — Encounter: Payer: Self-pay | Admitting: *Deleted

## 2019-09-27 NOTE — Telephone Encounter (Signed)
Called [facility] to make an appointment March 23rd 1100.

## 2019-09-27 NOTE — Congregational Nurse Program (Signed)
  Dept: [phone]   Congregational Nurse Program Note  Date of Encounter: 09/27/2019  Past Medical History: Past Medical History:  Diagnosis Date  . Hepatitis C   . History of rib fracture     Encounter Details: CNP Questionnaire - 09/27/19 1030      Questionnaire   Patient Status  Not Applicable    Race  Black or African American    Location Patient Served At  [employer]    Uninsured  Not Applicable    Food  No food insecurities    Housing/Utilities  No permanent housing    Transportation  No transportation needs    Interpersonal Safety  Yes, feel physically and emotionally safe where you currently live   Pt staying at [employer]   Medication  No medication insecurities    Medical Provider  No    Referrals  Dental;Primary Care Provider/Clinic   [facility]   ED Visit Averted  Not Applicable    Life-Saving Intervention Made  Not Applicable      Pt seen today at [facility] requesting dental and medical doctor referral. Pt is currently staying at [facility] [employer] and reports that "Lawanna Kobus" is helping him with housing. Appointment made with [facility]  March 23rd 1100 and [facility] with Dr Delford Field for March 9th at 2:00.

## 2019-09-27 NOTE — Telephone Encounter (Signed)
Called [facility] to make an appointment March 9th 2:00 with Dr. Delford Field.

## 2019-10-17 ENCOUNTER — Other Ambulatory Visit: Payer: Self-pay

## 2019-10-17 ENCOUNTER — Ambulatory Visit: Payer: Medicaid Other | Attending: Critical Care Medicine | Admitting: Critical Care Medicine

## 2019-10-17 ENCOUNTER — Encounter: Payer: Self-pay | Admitting: Critical Care Medicine

## 2019-10-17 VITALS — BP 132/83 | HR 89 | Temp 98.2°F | Resp 18 | Ht 64.0 in | Wt 165.0 lb

## 2019-10-17 DIAGNOSIS — K7400 Hepatic fibrosis, unspecified: Secondary | ICD-10-CM

## 2019-10-17 DIAGNOSIS — Z79899 Other long term (current) drug therapy: Secondary | ICD-10-CM | POA: Diagnosis not present

## 2019-10-17 DIAGNOSIS — Z5900 Homelessness unspecified: Secondary | ICD-10-CM | POA: Insufficient documentation

## 2019-10-17 DIAGNOSIS — F200 Paranoid schizophrenia: Secondary | ICD-10-CM | POA: Diagnosis not present

## 2019-10-17 DIAGNOSIS — R195 Other fecal abnormalities: Secondary | ICD-10-CM | POA: Insufficient documentation

## 2019-10-17 DIAGNOSIS — Z1211 Encounter for screening for malignant neoplasm of colon: Secondary | ICD-10-CM

## 2019-10-17 DIAGNOSIS — F1490 Cocaine use, unspecified, uncomplicated: Secondary | ICD-10-CM

## 2019-10-17 DIAGNOSIS — Z789 Other specified health status: Secondary | ICD-10-CM | POA: Insufficient documentation

## 2019-10-17 DIAGNOSIS — Z7289 Other problems related to lifestyle: Secondary | ICD-10-CM | POA: Diagnosis not present

## 2019-10-17 DIAGNOSIS — B182 Chronic viral hepatitis C: Secondary | ICD-10-CM

## 2019-10-17 NOTE — Patient Instructions (Addendum)
Complete metabolic panel, blood count, hepatitis C study and a fecal occult study will be obtained today  Follow the knee exercises as outlined below  Please abstain from cocaine and alcohol use I will connect you with our licensed clinical social worker Christa See  Return to see Geryl Rankins in 2 months to establish for primary care   Chronic Knee Pain, Adult Knee pain that lasts longer than 3 months is called chronic knee pain. You may have pain in one or both knees. Symptoms of chronic knee pain may also include swelling and stiffness. The most common cause is age-related wear and tear (osteoarthritis) of your knee joint. Many conditions can cause chronic knee pain. Treatment depends on the cause. The main treatments are physical therapy and weight loss. It may also be treated with medicines, injections, a knee sleeve or brace, and by using crutches. Rest, ice, compression (pressure), and elevation (RICE) therapy may also be recommended. Follow these instructions at home: If you have a knee sleeve or brace:   Wear it as told by your doctor. Remove it only as told by your doctor.  Loosen it if your toes: ? Tingle. ? Become numb. ? Turn cold and blue.  Keep it clean.  If the sleeve or brace is not waterproof: ? Do not let it get wet. ? Remove it if told by your doctor, or cover it with a watertight covering when you take a bath or shower. Managing pain, stiffness, and swelling      If told, put heat on your knee. Do this as often as told by your doctor. Use the heat source that your doctor recommends, such as a moist heat pack or a heating pad. ? If you have a removable sleeve or brace, remove it as told by your doctor. ? Place a towel between your skin and the heat source. ? Leave the heat on for 20-30 minutes. ? Remove the heat if your skin turns bright red. This is very important if you are unable to feel pain, heat, or cold. You may have a greater risk of getting  burned.  If told, put ice on your knee. ? If you have a removable sleeve or brace, remove it as told by your doctor. ? Put ice in a plastic bag. ? Place a towel between your skin and the bag. ? Leave the ice on for 20 minutes, 2-3 times a day.  Move your toes often.  Raise (elevate) the injured area above the level of your heart while you are sitting or lying down. Activity  Avoid activities where both feet leave the ground at the same time (high-impact activities). Examples are running, jumping rope, and doing jumping jacks.  Return to your normal activities as told by your doctor. Ask your doctor what activities are safe for you.  Follow the exercise plan that your doctor makes for you. Your doctor may suggest that you: ? Avoid activities that make knee pain worse. You may need to change the exercises that you do, the sports that you participate in, or your job duties. ? Wear shoes with cushioned soles. ? Avoid high-impact activities or sports that require running and sudden changes in direction. ? Do exercises or physical therapy as told by your doctor. Physical therapy is planned to match your needs and abilities. ? Do exercises that increase your balance and strength, such as tai chi and yoga.  Do not use your injured knee to support your body weight until your  doctor says that you can. Use crutches, a cane, or a walker, as told by your doctor. General instructions  Take over-the-counter and prescription medicines only as told by your doctor.  If you are overweight, work with your doctor and a food expert (dietitian) to set goals to lose weight. Being overweight can make your knee hurt more.  Do not use any products that contain nicotine or tobacco, such as cigarettes, e-cigarettes, and chewing tobacco. If you need help quitting, ask your doctor.  Keep all follow-up visits as told by your doctor. This is important. Contact a doctor if:  You have knee pain that is not getting  better or gets worse.  You are not able to do your exercises due to knee pain. Get help right away if:  Your knee swells and the swelling becomes worse.  You cannot move your knee.  You have very bad knee pain. Summary  Knee pain that lasts more than 3 months is considered chronic knee pain.  The main treatments for chronic knee pain are physical therapy and weight loss. You may also need to take medicines, wear a knee sleeve or brace, use crutches, and put ice or heat on your knee.  Lose weight if you are overweight. Work with your doctor and a food expert (dietitian) to help you set goals to lose weight. Being overweight can make your knee hurt more.  Work with a physical therapist to make a safe exercise program, as told by your doctor. This information is not intended to replace advice given to you by your health care provider. Make sure you discuss any questions you have with your health care provider. Document Revised: 10/06/2018 Document Reviewed: 10/06/2018 Elsevier Patient Education  [place].

## 2019-10-17 NOTE — Assessment & Plan Note (Signed)
History of mild liver fibrosis from prior hepatitis C and current ongoing alcohol and cocaine use  Again I counseled the patient regarding alcohol and cocaine use and I will be connecting this patient to our licensed clinical social worker

## 2019-10-17 NOTE — Assessment & Plan Note (Signed)
As per schizophrenia assessment 

## 2019-10-17 NOTE — Progress Notes (Signed)
Subjective:    Patient ID: [name], male    DOB: 25-Sep-1955, 64 y.o.   MRN: [id number]  This is a 64 year old homeless gentleman who is seen post ER visit for right knee pain and wishing to reestablish for primary care in the clinic.  He is a former primary care patient of [name].  Has not been seen here since 2019.  He now has full Medicaid at this time.  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  He has history of chronic hepatitis C with mild liver fibrosis but ultrasound showing stability last checked in 2019  The patient took a course of antiviral therapy and did show complete resolution of RNA qualitative and quantitative hepatitis C  The patient states his right knee intermittently hurts.  He works as a Copy at [employer] and feels this is aggravating his knee at times.  He denies any nausea vomiting abdominal pain or other medical complaints.  He does state he has chronic anxiety and was in prison for 21 years secondary to robbery and at that time was diagnosed with paranoid schizophrenia.  He currently is not under any therapy at this time.     Past Medical History:  Diagnosis Date  . Hepatitis C   . History of rib fracture      Family History  Problem Relation Age of Onset  . Diabetes Neg Hx   . Hypertension Neg Hx      Social History   Socioeconomic History  . Marital status: Single    Spouse name: Not on file  . Number of children: Not on file  . Years of education: Not on file  . Highest education level: Not on file  Occupational History  . Not on file  Tobacco Use  . Smoking status: Never Smoker  . Smokeless tobacco: Never Used  Substance and Sexual Activity  . Alcohol use: Yes    Alcohol/week: 42.0 standard drinks    Types: 42 Cans of beer per week    Comment: pt reports drinking 6 pk everyday  . Drug use: Yes    Types: "Crack" cocaine    Comment: once a  month  . Sexual activity: Yes    Partners: Female  Other Topics Concern  . Not on file  Social History Narrative  . Not on file   Social Determinants of Health   Financial Resource Strain:   . Difficulty of Paying Living Expenses: Not on file  Food Insecurity:   . Worried About Programme researcher, broadcasting/film/video in the Last Year: Not on file  . Ran Out of Food in the Last Year: Not on file  Transportation Needs:   . Lack of Transportation (Medical): Not on file  . Lack of Transportation (Non-Medical): Not on file  Physical Activity:   . Days of Exercise per Week: Not on file  . Minutes of Exercise per Session: Not on file  Stress:   . Feeling of Stress : Not on file  Social Connections:   . Frequency of Communication with Friends and Family: Not on file  . Frequency of Social Gatherings with Friends and Family: Not on file  . Attends Religious Services: Not on file  . Active Member of Clubs or Organizations: Not on file  . Attends Banker Meetings: Not on file  . Marital Status: Not on file  Intimate Partner Violence:   .  Fear of Current or Ex-Partner: Not on file  . Emotionally Abused: Not on file  . Physically Abused: Not on file  . Sexually Abused: Not on file     No Known Allergies   Outpatient Medications Prior to Visit  Medication Sig Dispense Refill  . ibuprofen (ADVIL) 400 MG tablet Take 1 tablet (400 mg total) by mouth every 6 (six) hours as needed. 30 tablet 0  . Glecaprevir-Pibrentasvir (MAVYRET) 100-40 MG TABS Take 3 tablets by mouth daily with breakfast. 84 tablet 1  . ondansetron (ZOFRAN ODT) 4 MG disintegrating tablet Take 1 tablet (4 mg total) by mouth every 8 (eight) hours as needed for nausea or vomiting. 20 tablet 0   No facility-administered medications prior to visit.     Review of Systems Constitutional:   No  weight loss, night sweats,  Fevers, chills, fatigue, lassitude. HEENT:   No headaches,  Difficulty swallowing,  Tooth/dental problems,   Sore throat,                No sneezing, itching, ear ache, nasal congestion, post nasal drip,   CV:  No chest pain,  Orthopnea, PND, swelling in lower extremities, anasarca, dizziness, palpitations  GI  No heartburn, indigestion, abdominal pain, nausea, vomiting, diarrhea, change in bowel habits, loss of appetite  Resp: No shortness of breath with exertion or at rest.  No excess mucus, no productive cough,  No non-productive cough,  No coughing up of blood.  No change in color of mucus.  No wheezing.  No chest wall deformity  Skin: no rash or lesions.  GU: no dysuria, change in color of urine, no urgency or frequency.  No flank pain.  MS:   joint pain or swelling.  No decreased range of motion.  No back pain.  Psych:   change in mood or affect. No depression or anxiety.  No memory loss.     Objective:   Physical Exam  Vitals:   10/17/19 1404  BP: 132/83  Pulse: 89  Resp: 18  Temp: 98.2 F (36.8 C)  TempSrc: Oral  SpO2: 98%  Weight: 165 lb (74.8 kg)  Height: 5\' 4"  (1.626 m)    Gen: Pleasant, well-nourished, in no distress,  normal affect  ENT: No lesions,  mouth clear,  oropharynx clear, no postnasal drip  Neck: No JVD, no TMG, no carotid bruits  Lungs: No use of accessory muscles, no dullness to percussion, clear without rales or rhonchi  Cardiovascular: RRR, heart sounds normal, no murmur or gallops, no peripheral edema  Abdomen: soft and NT, no HSM,  BS normal  Musculoskeletal: No deformities, no cyanosis or clubbing Minimal tenderness in the patellar area of the right knee without deformity  Neuro: alert, non focal  Skin: Warm, no lesions or rashes  No results found.       Assessment & Plan:  I personally reviewed all images and lab data in the [facility] system as well as any outside material available during this office visit and agree with the  radiology impressions.   Chronic hepatitis C without hepatic coma (HCC) Chronic hepatitis C without hepatic coma   Plan to repeat viral studies  Liver fibrosis History of mild liver fibrosis from prior hepatitis C and current ongoing alcohol and cocaine use  Again I counseled the patient regarding alcohol and cocaine use and I will be connecting this patient to our licensed clinical social worker  Paranoid schizophrenia [facility]) Patient gives a history of paranoid schizophrenia and I  am not able to clarify this diagnosis at this time and he is not on any medications  I did fill out his housing form indicating he does need assistance with housing due to his mental health chronic alcohol cocaine use  I will connect this patient with our licensed clinical social worker to see if he can access mental health services for this patient  Alcohol use As per schizophrenia assessment  Cocaine use As per schizophrenia assessment   Tarquin was seen today for hospitalization follow-up.  Diagnoses and all orders for this visit:  Chronic hepatitis C without hepatic coma (HCC) -     CBC with Differential/Platelet; Future -     Hepatitis c vrs RNA detect by PCR-qual -     CBC with Differential/Platelet  Alcohol use -     Comprehensive metabolic panel -     CBC with Differential/Platelet; Future -     CBC with Differential/Platelet  Cocaine use  Homelessness  Colon cancer screening -     Fecal occult blood, imunochemical  Liver fibrosis -     Comprehensive metabolic panel -     CBC with Differential/Platelet; Future -     CBC with Differential/Platelet  Paranoid schizophrenia (HCC)   We will also have the patient return back to Ms. Fleming's care with follow-up visit

## 2019-10-17 NOTE — Assessment & Plan Note (Signed)
As per schizophrenia assessment

## 2019-10-17 NOTE — Assessment & Plan Note (Signed)
Chronic hepatitis C without hepatic coma  Plan to repeat viral studies

## 2019-10-17 NOTE — Assessment & Plan Note (Signed)
Patient gives a history of paranoid schizophrenia and I am not able to clarify this diagnosis at this time and he is not on any medications  I did fill out his housing form indicating he does need assistance with housing due to his mental health chronic alcohol cocaine use  I will connect this patient with our licensed clinical social worker to see if he can access mental health services for this patient

## 2019-10-20 LAB — HEPATITIS C VRS RNA DETECT BY PCR-QUAL: HCV RNA NAA Qualitative: NEGATIVE

## 2019-10-20 LAB — COMPREHENSIVE METABOLIC PANEL
ALT: 18 IU/L (ref 0–44)
AST: 15 IU/L (ref 0–40)
Albumin/Globulin Ratio: 1.2 (ref 1.2–2.2)
Albumin: 4.1 g/dL (ref 3.8–4.8)
Alkaline Phosphatase: 99 IU/L (ref 39–117)
BUN/Creatinine Ratio: 12 (ref 10–24)
BUN: 10 mg/dL (ref 8–27)
Bilirubin Total: 0.3 mg/dL (ref 0.0–1.2)
CO2: 21 mmol/L (ref 20–29)
Calcium: 9.6 mg/dL (ref 8.6–10.2)
Chloride: 101 mmol/L (ref 96–106)
Creatinine, Ser: 0.85 mg/dL (ref 0.76–1.27)
GFR calc Af Amer: 109 mL/min/{1.73_m2} (ref >59)
GFR calc non Af Amer: 94 mL/min/{1.73_m2} (ref >59)
Globulin, Total: 3.5 g/dL (ref 1.5–4.5)
Glucose: 80 mg/dL (ref 65–99)
Potassium: 4.2 mmol/L (ref 3.5–5.2)
Sodium: 138 mmol/L (ref 134–144)
Total Protein: 7.6 g/dL (ref 6.0–8.5)

## 2019-10-23 ENCOUNTER — Telehealth: Payer: Self-pay | Admitting: *Deleted

## 2019-10-23 ENCOUNTER — Encounter: Payer: Self-pay | Admitting: *Deleted

## 2019-10-23 NOTE — Telephone Encounter (Signed)
Letter sent to patient to call office for results due to office not being able to reach patient to provide him with results.

## 2019-10-27 LAB — FECAL OCCULT BLOOD, IMMUNOCHEMICAL: Fecal Occult Bld: POSITIVE — AB

## 2019-11-02 ENCOUNTER — Encounter: Payer: Self-pay | Admitting: Critical Care Medicine

## 2019-11-02 ENCOUNTER — Other Ambulatory Visit: Payer: Self-pay

## 2019-11-02 ENCOUNTER — Ambulatory Visit: Payer: Medicaid Other | Attending: Critical Care Medicine | Admitting: Critical Care Medicine

## 2019-11-02 DIAGNOSIS — M25561 Pain in right knee: Secondary | ICD-10-CM

## 2019-11-02 DIAGNOSIS — K056 Periodontal disease, unspecified: Secondary | ICD-10-CM | POA: Diagnosis not present

## 2019-11-02 DIAGNOSIS — M25569 Pain in unspecified knee: Secondary | ICD-10-CM | POA: Insufficient documentation

## 2019-11-02 DIAGNOSIS — M25562 Pain in left knee: Secondary | ICD-10-CM

## 2019-11-02 DIAGNOSIS — K029 Dental caries, unspecified: Secondary | ICD-10-CM | POA: Insufficient documentation

## 2019-11-02 DIAGNOSIS — K649 Unspecified hemorrhoids: Secondary | ICD-10-CM

## 2019-11-02 DIAGNOSIS — R195 Other fecal abnormalities: Secondary | ICD-10-CM | POA: Diagnosis not present

## 2019-11-02 DIAGNOSIS — G8929 Other chronic pain: Secondary | ICD-10-CM

## 2019-11-02 NOTE — Progress Notes (Signed)
States that he has burning and some bleeding when using the bathroom.

## 2019-11-02 NOTE — Progress Notes (Signed)
Subjective:    Patient ID: [name], male    DOB: 22-Dec-1955, 64 y.o.   MRN: [id number] Virtual Visit via Telephone Note  I connected with [name] on 11/03/19 at  9:00 AM EDT by telephone and verified that I am speaking with the correct person using two identifiers.   Consent:  I discussed the limitations, risks, security and privacy concerns of performing an evaluation and management service by telephone and the availability of in person appointments. I also discussed with the patient that there may be a patient responsible charge related to this service. The patient expressed understanding and agreed to proceed.  Location of patient: The patient was at home  Location of provider: I was in my home office  Persons participating in the televisit with the patient.   No one else on the call   History of Present Illness: This is a 64 year old homeless gentleman who is seen post ER visit for right knee pain and wishing to reestablish for primary care in the clinic.  He is a former primary care patient of [name].  Has not been seen here since 2019.  He now has full Medicaid at this time.  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  He has history of chronic hepatitis C with mild liver fibrosis but ultrasound showing stability last checked in 2019  The patient took a course of antiviral therapy and did show complete resolution of RNA qualitative and quantitative hepatitis C  The patient states his right knee intermittently hurts.  He works as a Copy at [employer] and feels this is aggravating his knee at times.  He denies any nausea vomiting abdominal pain or other medical complaints.  He does state he has chronic anxiety and was in prison for 21 years secondary to robbery and at that time was diagnosed with paranoid schizophrenia.  He currently is not under any therapy at this  time.  11/02/2019 Telephone visit for heme pos stools.  ?? Hemorrhoidal issues. This patient is having a telemedicine telephone visit today to follow-up on the fact that he is fecal occult study was positive for blood.  Note previous hemoglobin was normal.  Patient states he has been noticing some blood on the tissue paper and in his stools and has noticed external hemorrhoids as well as internal hemorrhoid pain.  He denies any vomiting he denies any abdominal pain there is no emesis.  He states he is not currently drinking alcohol whereas this was an issue before.  He claims he is not using cocaine currently.  He is yet to follow-up with our licensed clinical social worker in regards to these 2 issues.  He is currently working on a voucher to obtain housing as he is currently homeless.  He also has an upcoming dental appointment for his teeth.  Note at the last visit his hepatitis C studies were negative with no active viral copies  We discussed the necessity for him to find a mental health provider for his history of schizophrenia and he is interested in going back to [place]   Past Medical History:  Diagnosis Date  . Hepatitis C   . History of rib fracture      Family History  Problem Relation Age of Onset  . Diabetes Neg Hx   . Hypertension Neg Hx      Social History   Socioeconomic History  .  Marital status: Single    Spouse name: Not on file  . Number of children: Not on file  . Years of education: Not on file  . Highest education level: Not on file  Occupational History  . Not on file  Tobacco Use  . Smoking status: Never Smoker  . Smokeless tobacco: Never Used  Substance and Sexual Activity  . Alcohol use: Yes    Alcohol/week: 42.0 standard drinks    Types: 42 Cans of beer per week    Comment: pt reports drinking 6 pk everyday  . Drug use: Yes    Types: "Crack" cocaine    Comment: once a month  . Sexual activity: Yes    Partners: Female  Other Topics Concern  .  Not on file  Social History Narrative  . Not on file   Social Determinants of Health   Financial Resource Strain:   . Difficulty of Paying Living Expenses:   Food Insecurity:   . Worried About Programme researcher, broadcasting/film/video in the Last Year:   . Barista in the Last Year:   Transportation Needs:   . Freight forwarder (Medical):   Marland Kitchen Lack of Transportation (Non-Medical):   Physical Activity:   . Days of Exercise per Week:   . Minutes of Exercise per Session:   Stress:   . Feeling of Stress :   Social Connections:   . Frequency of Communication with Friends and Family:   . Frequency of Social Gatherings with Friends and Family:   . Attends Religious Services:   . Active Member of Clubs or Organizations:   . Attends Banker Meetings:   Marland Kitchen Marital Status:   Intimate Partner Violence:   . Fear of Current or Ex-Partner:   . Emotionally Abused:   Marland Kitchen Physically Abused:   . Sexually Abused:      No Known Allergies   Outpatient Medications Prior to Visit  Medication Sig Dispense Refill  . ibuprofen (ADVIL) 400 MG tablet Take 1 tablet (400 mg total) by mouth every 6 (six) hours as needed. (Patient not taking: Reported on 11/02/2019) 30 tablet 0   No facility-administered medications prior to visit.     Review of Systems Constitutional:   No  weight loss, night sweats,  Fevers, chills, fatigue, lassitude. HEENT:   No headaches,  Difficulty swallowing,  Tooth/dental problems,  Sore throat,                No sneezing, itching, ear ache, nasal congestion, post nasal drip,   CV:  No chest pain,  Orthopnea, PND, swelling in lower extremities, anasarca, dizziness, palpitations  GI  No heartburn, indigestion, abdominal pain, nausea, vomiting, diarrhea, change in bowel habits, loss of appetite  Resp: No shortness of breath with exertion or at rest.  No excess mucus, no productive cough,  No non-productive cough,  No coughing up of blood.  No change in color of mucus.  No  wheezing.  No chest wall deformity  Skin: no rash or lesions.  GU: no dysuria, change in color of urine, no urgency or frequency.  No flank pain.  MS:   joint pain or swelling.  No decreased range of motion.  No back pain.  Psych:   change in mood or affect. No depression or anxiety.  No memory loss.     Objective:   Physical Exam  There were no vitals filed for this visit. Phone visit , no exam  No results found.  Assessment & Plan:  I personally reviewed all images and lab data in the [facility] system as well as any outside material available during this office visit and agree with the  radiology impressions.   Heme positive stool Heme positive stools on the basis of hemorrhoids  I prescribed Anusol cream and suppositories for this patient and he will pick these up at his ability to come by the clinic  Bleeding hemorrhoid As per heme positive stool assessment  We will have the patient return to the clinic in follow-up with his primary care provider where a rectal exam will need to be obtained  Periodontal disease I encouraged the patient to keep his dental appointment   Heith was seen today for hemorrhoids.  Diagnoses and all orders for this visit:  Bleeding hemorrhoid  Chronic pain of both knees  Dental caries  Periodontal disease  Heme positive stool  Other orders -     hydrocortisone (ANUSOL-HC) 25 MG suppository; Place 1 suppository (25 mg total) rectally 2 (two) times daily. -     hydrocortisone (ANUSOL-HC) 2.5 % rectal cream; Place 1 application rectally 2 (two) times daily.   Follow Up Instructions: The patient knows to obtain anusol medications for hemorrhoids at clinic today and keep f/u appts with Ms Raul Del upcoming in April    I discussed the assessment and treatment plan with the patient. The patient was provided an opportunity to ask questions and all were answered. The patient agreed with the plan and demonstrated an understanding of the  instructions.   The patient was advised to call back or seek an in-person evaluation if the symptoms worsen or if the condition fails to improve as anticipated.  I provided 30 minutes of non-face-to-face time during this encounter  including  median intraservice time , review of notes, labs, imaging, medications  and explaining diagnosis and management to the patient .    Asencion Noble, MD

## 2019-11-03 MED ORDER — HYDROCORTISONE ACETATE 25 MG RE SUPP: 25.0000 mg | Freq: Two times a day (BID) | RECTAL | 0 refills | Status: DC

## 2019-11-03 MED ORDER — HYDROCORTISONE (PERIANAL) 2.5 % EX CREA: 1.0000 "application " | TOPICAL_CREAM | Freq: Two times a day (BID) | CUTANEOUS | 0 refills | Status: DC

## 2019-11-03 NOTE — Assessment & Plan Note (Signed)
As per heme positive stool assessment  We will have the patient return to the clinic in follow-up with his primary care provider where a rectal exam will need to be obtained

## 2019-11-03 NOTE — Assessment & Plan Note (Signed)
I encouraged the patient to keep his dental appointment

## 2019-11-03 NOTE — Assessment & Plan Note (Signed)
Heme positive stools on the basis of hemorrhoids  I prescribed Anusol cream and suppositories for this patient and he will pick these up at his ability to come by the clinic

## 2019-11-04 ENCOUNTER — Other Ambulatory Visit: Payer: Self-pay | Admitting: Nurse Practitioner

## 2019-11-04 DIAGNOSIS — Z1211 Encounter for screening for malignant neoplasm of colon: Secondary | ICD-10-CM

## 2019-11-04 DIAGNOSIS — R195 Other fecal abnormalities: Secondary | ICD-10-CM

## 2019-11-23 ENCOUNTER — Ambulatory Visit: Payer: Medicaid Other | Attending: Internal Medicine

## 2019-11-23 DIAGNOSIS — Z23 Encounter for immunization: Secondary | ICD-10-CM

## 2019-11-23 NOTE — Progress Notes (Signed)
   Covid-19 Vaccination Clinic  Name:  [name]    MRN: [id number] DOB: 01/15/1956  11/23/2019  Mr. [name] was observed post Covid-19 immunization for 15 minutes without incident. He was provided with Vaccine Information Sheet and instruction to access the V-Safe system.   Mr. [name] was instructed to call 911 with any severe reactions post vaccine: Marland Kitchen Difficulty breathing  . Swelling of face and throat  . A fast heartbeat  . A bad rash all over body  . Dizziness and weakness   Immunizations Administered    Name Date Dose VIS Date Route   Moderna COVID-19 Vaccine 11/23/2019 12:55 PM 0.5 mL 07/11/2019 Intramuscular   Manufacturer: Moderna   Lot: [id number]   NDC: 75300-511-02

## 2019-11-28 MED FILL — AMOXICILLIN 500 MG CAPSULE: 500 | 7 days supply | Qty: 21 | Fill #0

## 2019-11-28 MED FILL — HYDROCODON-APAP 5-325: 5-325 | 4 days supply | Qty: 20 | Fill #0

## 2019-12-11 ENCOUNTER — Encounter: Payer: Self-pay | Admitting: Nurse Practitioner

## 2019-12-15 ENCOUNTER — Other Ambulatory Visit: Payer: Self-pay | Admitting: Nurse Practitioner

## 2019-12-18 ENCOUNTER — Other Ambulatory Visit: Payer: Self-pay

## 2019-12-18 ENCOUNTER — Encounter: Payer: Self-pay | Admitting: Nurse Practitioner

## 2019-12-18 ENCOUNTER — Ambulatory Visit: Payer: Medicaid Other | Attending: Nurse Practitioner | Admitting: Nurse Practitioner

## 2019-12-18 DIAGNOSIS — Z7289 Other problems related to lifestyle: Secondary | ICD-10-CM | POA: Diagnosis not present

## 2019-12-18 DIAGNOSIS — Z8619 Personal history of other infectious and parasitic diseases: Secondary | ICD-10-CM | POA: Diagnosis not present

## 2019-12-18 DIAGNOSIS — Z1211 Encounter for screening for malignant neoplasm of colon: Secondary | ICD-10-CM | POA: Diagnosis not present

## 2019-12-18 DIAGNOSIS — B182 Chronic viral hepatitis C: Secondary | ICD-10-CM | POA: Diagnosis not present

## 2019-12-18 DIAGNOSIS — Z789 Other specified health status: Secondary | ICD-10-CM

## 2019-12-18 DIAGNOSIS — R195 Other fecal abnormalities: Secondary | ICD-10-CM | POA: Diagnosis not present

## 2019-12-18 DIAGNOSIS — K625 Hemorrhage of anus and rectum: Secondary | ICD-10-CM | POA: Diagnosis not present

## 2019-12-18 NOTE — Progress Notes (Signed)
Virtual Visit via Telephone Note Due to national recommendations of social distancing due to [place] 19, telehealth visit is felt to be most appropriate for this patient at this time.  I discussed the limitations, risks, security and privacy concerns of performing an evaluation and management service by telephone and the availability of in person appointments. I also discussed with the patient that there may be a patient responsible charge related to this service. The patient expressed understanding and agreed to proceed.    I connected with [name] on 12/18/19  at   1:30 PM EDT  EDT by telephone and verified that I am speaking with the correct person using two identifiers.   Consent I discussed the limitations, risks, security and privacy concerns of performing an evaluation and management service by telephone and the availability of in person appointments. I also discussed with the patient that there may be a patient responsible charge related to this service. The patient expressed understanding and agreed to proceed.   Location of Patient: Private Residence    Location of Provider: [place] participating in Telemedicine visit: Andrew Rankins FNP-BC [place]    History of Present Illness: Telemedicine visit for: Rectal Bleeding On 11/02/2019 He had a televisit for heme pos stools.   Patient stated he had been noticing some blood on the tissue paper and in his stools and had noticed external hemorrhoids as well as internal hemorrhoid pain.  He denied any recent alcohol use, vomiting, abdominal pain or emesis.  He was referred to GI.  Today he states he no longer is having rectal bleeding. States he did not schedule with GI because he stopped bleeding. I have given him the number to [place] and instructed him to call as he is overdue for colonoscopy and his FOBT was positive for blood. He states he will call them to  schedule.        Past Medical History:  Diagnosis Date  . Hepatitis C   . History of rib fracture     Past Surgical History:  Procedure Laterality Date  . ORIF ANKLE FRACTURE Left 10/15/2017   Procedure: OPEN REDUCTION INTERNAL FIXATION (ORIF) ANKLE FRACTURE;  Surgeon: Shona Needles, MD;  Location: [place];  Service: Orthopedics;  Laterality: Left;  . ORIF ANKLE FRACTURE Left 10/18/2017   Procedure: OPEN REDUCTION INTERNAL FIXATION (ORIF) ANKLE FRACTURE;  Surgeon: Shona Needles, MD;  Location: [place];  Service: Orthopedics;  Laterality: Left;    Family History  Problem Relation Age of Onset  . Diabetes Neg Hx   . Hypertension Neg Hx     Social History   Socioeconomic History  . Marital status: Single    Spouse name: Not on file  . Number of children: Not on file  . Years of education: Not on file  . Highest education level: Not on file  Occupational History  . Not on file  Tobacco Use  . Smoking status: Never Smoker  . Smokeless tobacco: Never Used  Substance and Sexual Activity  . Alcohol use: Yes    Alcohol/week: 42.0 standard drinks    Types: 42 Cans of beer per week    Comment: pt reports drinking 6 pk everyday  . Drug use: Yes    Types: "Crack" cocaine    Comment: once a month  . Sexual activity: Yes    Partners: Female  Other Topics Concern  . Not on file  Social History  Narrative  . Not on file   Social Determinants of Health   Financial Resource Strain:   . Difficulty of Paying Living Expenses:   Food Insecurity:   . Worried About Programme researcher, broadcasting/film/video in the Last Year:   . Barista in the Last Year:   Transportation Needs:   . Freight forwarder (Medical):   Marland Kitchen Lack of Transportation (Non-Medical):   Physical Activity:   . Days of Exercise per Week:   . Minutes of Exercise per Session:   Stress:   . Feeling of Stress :   Social Connections:   . Frequency of Communication with Friends and Family:   . Frequency of Social Gatherings  with Friends and Family:   . Attends Religious Services:   . Active Member of Clubs or Organizations:   . Attends Banker Meetings:   Marland Kitchen Marital Status:      Observations/Objective: Awake, alert and oriented x 3   Review of Systems  Constitutional: Negative for fever, malaise/fatigue and weight loss.  HENT: Negative.  Negative for nosebleeds.   Eyes: Negative.  Negative for blurred vision, double vision and photophobia.  Respiratory: Negative.  Negative for cough and shortness of breath.   Cardiovascular: Negative.  Negative for chest pain, palpitations and leg swelling.  Gastrointestinal: Negative.  Negative for heartburn, nausea and vomiting.  Musculoskeletal: Negative.  Negative for myalgias.  Neurological: Negative.  Negative for dizziness, focal weakness, seizures and headaches.  Psychiatric/Behavioral: Negative.  Negative for suicidal ideas.    Assessment and Plan: [name] was seen today for follow-up.  Diagnoses and all orders for this visit:  Positive fecal immunochemical test Referred back to GI Patient given number to contact  [phone]  Colon cancer screening Referred back to GI Patient given number to contact  [phone]  Chronic hepatitis C without hepatic coma (HCC) The patient took a course of antiviral therapy and did show complete resolution of RNA qualitative and quantitative hepatitis C  Alcohol use Recommended AA counseling    Follow Up Instructions Return in about 3 months (around 03/19/2020).     I discussed the assessment and treatment plan with the patient. The patient was provided an opportunity to ask questions and all were answered. The patient agreed with the plan and demonstrated an understanding of the instructions.   The patient was advised to call back or seek an in-person evaluation if the symptoms worsen or if the condition fails to improve as anticipated.  I provided 15 minutes of non-face-to-face time during this  encounter including median intraservice time, reviewing previous notes, labs, imaging, medications and explaining diagnosis and management.  Claiborne Rigg, FNP-BC

## 2019-12-21 ENCOUNTER — Ambulatory Visit: Payer: Medicaid Other | Attending: Internal Medicine

## 2019-12-21 DIAGNOSIS — Z23 Encounter for immunization: Secondary | ICD-10-CM

## 2019-12-21 NOTE — Progress Notes (Signed)
   Covid-19 Vaccination Clinic  Name:  [name]    MRN: [id number] DOB: [birth year]  12/21/2019  [name] was observed post Covid-19 immunization for 15 minutes without incident. He was provided with Vaccine Information Sheet and instruction to access the V-Safe system.   [name] was instructed to call 911 with any severe reactions post vaccine: Marland Kitchen Difficulty breathing  . Swelling of face and throat  . A fast heartbeat  . A bad rash all over body  . Dizziness and weakness   Immunizations Administered    Name Date Dose VIS Date Route   Moderna COVID-19 Vaccine 12/21/2019 11:09 AM 0.5 mL 07/2019 Intramuscular   Manufacturer: Moderna   Lot: [id number]   NDC: 70964-383-81

## 2020-04-12 ENCOUNTER — Telehealth: Payer: Self-pay | Admitting: *Deleted

## 2020-04-12 ENCOUNTER — Encounter: Payer: Self-pay | Admitting: *Deleted

## 2020-04-12 NOTE — Congregational Nurse Program (Signed)
  Dept: [phone]   Congregational Nurse Program Note  Date of Encounter: 04/12/2020  Past Medical History: Past Medical History:  Diagnosis Date  . Hepatitis C   . History of rib fracture     Encounter Details:  CNP Questionnaire - 04/12/20 1349      Questionnaire   Patient Status Not Applicable    Race Black or African American    Location Patient Served At [employer]    Uninsured Not Applicable    Food No food insecurities    Housing/Utilities Yes, have permanent housing    Transportation No transportation needs    Interpersonal Safety Yes, feel physically and emotionally safe where you currently live    Medication No medication insecurities    Medical Provider No    Referrals Primary Care Provider/Clinic    ED Visit Averted Not Applicable    Life-Saving Intervention Made Not Applicable           Client came to [facility] requesting help with a PCP. He reports that medicaid has requested he have a PCP and he would like to be seen by the doctor that treated him at [facility] in the past. Per chart he was seen by [name] NP.Called and made an appointment with [name] with [facility] for Oct 8th at 2:50. Avinash Maltos W. RN  [phone]

## 2020-04-12 NOTE — Telephone Encounter (Signed)
Called and made an appointment for client Oct 8th 2:50 with [facility].

## 2020-04-12 NOTE — Telephone Encounter (Signed)
Attempted to call client twice with appointment information. He left number as [phone]. Will try to locate client at [facility].

## 2020-05-15 ENCOUNTER — Telehealth: Payer: Self-pay | Admitting: *Deleted

## 2020-05-15 NOTE — Telephone Encounter (Signed)
Called [phone] and left message for client to return call. Called to discuss upcoming appt details with [facility].

## 2020-05-17 ENCOUNTER — Encounter: Payer: Medicaid Other | Admitting: Nurse Practitioner

## 2021-01-31 ENCOUNTER — Telehealth: Payer: Self-pay | Admitting: Nurse Practitioner

## 2021-01-31 NOTE — Telephone Encounter (Signed)
Patient had appt with [name] on June 27 at 1:30 but provider will be working virtual. Called patient no answer and unable to leave voicemail. If patient returns call please schedule for the next available physical slot with [name].

## 2021-02-03 ENCOUNTER — Encounter: Payer: Medicaid Other | Admitting: Nurse Practitioner

## 2021-03-28 ENCOUNTER — Encounter: Payer: Self-pay | Admitting: Nurse Practitioner

## 2021-03-28 ENCOUNTER — Ambulatory Visit: Payer: Medicaid Other | Attending: Nurse Practitioner | Admitting: Nurse Practitioner

## 2021-03-28 ENCOUNTER — Other Ambulatory Visit: Payer: Self-pay

## 2021-03-28 VITALS — BP 108/74 | HR 93 | Ht 64.0 in | Wt 148.4 lb

## 2021-03-28 DIAGNOSIS — D72819 Decreased white blood cell count, unspecified: Secondary | ICD-10-CM | POA: Diagnosis not present

## 2021-03-28 DIAGNOSIS — R7309 Other abnormal glucose: Secondary | ICD-10-CM

## 2021-03-28 DIAGNOSIS — Z1211 Encounter for screening for malignant neoplasm of colon: Secondary | ICD-10-CM

## 2021-03-28 DIAGNOSIS — Z0001 Encounter for general adult medical examination with abnormal findings: Secondary | ICD-10-CM | POA: Diagnosis not present

## 2021-03-28 DIAGNOSIS — E785 Hyperlipidemia, unspecified: Secondary | ICD-10-CM

## 2021-03-28 DIAGNOSIS — Z0000 Encounter for general adult medical examination without abnormal findings: Secondary | ICD-10-CM

## 2021-03-28 DIAGNOSIS — E876 Hypokalemia: Secondary | ICD-10-CM

## 2021-03-28 NOTE — Progress Notes (Signed)
Assessment & Plan:  [name] was seen today for annual exam.  Diagnoses and all orders for this visit:  Encounter for annual physical exam  Colon cancer screening -     Ambulatory referral to Gastroenterology  Hypokalemia -     CMP14+EGFR  Elevated glucose -     Hemoglobin A1c  Dyslipidemia, goal LDL below 100 -     Lipid panel   Patient has been counseled on age-appropriate routine health concerns for screening and prevention. These are reviewed and up-to-date. Referrals have been placed accordingly. Immunizations are up-to-date or declined.    Subjective:   Chief Complaint  Patient presents with   Annual Exam   HPI [name] 65 y.o. male presents to office today for annual physical.  He has a past medical history of Hepatitis C and History of rib fracture.  Hep RNA negative 10-17-2019  He does endorse insomnia and states only getting about 4 hours of sleep at night.  He declines any prescription sleep aids today. Review of Systems  Constitutional:  Negative for fever, malaise/fatigue and weight loss.  HENT: Negative.  Negative for nosebleeds.   Eyes: Negative.  Negative for blurred vision, double vision and photophobia.  Respiratory: Negative.  Negative for cough and shortness of breath.   Cardiovascular: Negative.  Negative for chest pain, palpitations and leg swelling.  Gastrointestinal: Negative.  Negative for heartburn, nausea and vomiting.  Genitourinary: Negative.   Musculoskeletal: Negative.  Negative for myalgias.  Skin: Negative.   Neurological: Negative.  Negative for dizziness, focal weakness, seizures and headaches.  Endo/Heme/Allergies: Negative.   Psychiatric/Behavioral:  Negative for suicidal ideas. The patient has insomnia.    Past Medical History:  Diagnosis Date   Hepatitis C    History of rib fracture     Past Surgical History:  Procedure Laterality Date   ORIF ANKLE FRACTURE Left 10/15/2017   Procedure: OPEN REDUCTION INTERNAL FIXATION  (ORIF) ANKLE FRACTURE;  Surgeon: Shona Needles, MD;  Location: [facility];  Service: Orthopedics;  Laterality: Left;   ORIF ANKLE FRACTURE Left 10/18/2017   Procedure: OPEN REDUCTION INTERNAL FIXATION (ORIF) ANKLE FRACTURE;  Surgeon: Shona Needles, MD;  Location: [facility];  Service: Orthopedics;  Laterality: Left;    Family History  Problem Relation Age of Onset   Diabetes Neg Hx    Hypertension Neg Hx     Social History Reviewed with no changes to be made today.   Outpatient Medications Prior to Visit  Medication Sig Dispense Refill   hydrocortisone (ANUSOL-HC) 2.5 % rectal cream Place 1 application rectally 2 (two) times daily. (Patient not taking: Reported on 03/28/2021) 30 g 0   hydrocortisone (ANUSOL-HC) 25 MG suppository Place 1 suppository (25 mg total) rectally 2 (two) times daily. (Patient not taking: No sig reported) 12 suppository 0   No facility-administered medications prior to visit.    No Known Allergies     Objective:    BP 108/74   Pulse 93   Ht '5\' 4"'  (1.626 m)   Wt 148 lb 6.4 oz (67.3 kg)   SpO2 96%   BMI 25.47 kg/m  Wt Readings from Last 3 Encounters:  03/28/21 148 lb 6.4 oz (67.3 kg)  10/17/19 165 lb (74.8 kg)  09/22/19 170 lb (77.1 kg)    Physical Exam Constitutional:      Appearance: He is well-developed.  HENT:     Head: Normocephalic and atraumatic.     Jaw: There is normal jaw occlusion.  Right Ear: Hearing, tympanic membrane, ear canal and external ear normal.     Left Ear: Hearing, tympanic membrane, ear canal and external ear normal.     Nose: Nose normal.     Right Turbinates: Not swollen or pale.     Left Turbinates: Not swollen or pale.     Right Sinus: No maxillary sinus tenderness or frontal sinus tenderness.     Left Sinus: No maxillary sinus tenderness or frontal sinus tenderness.     Mouth/Throat:     Dentition: Abnormal dentition. Does not have dentures. Dental caries present. No dental tenderness, gingival swelling, dental  abscesses or gum lesions.     Tongue: No lesions.     Palate: No mass.     Pharynx: Oropharynx is clear. Uvula midline.     Tonsils: No tonsillar exudate or tonsillar abscesses.  Eyes:     General: Lids are normal. No scleral icterus.       Right eye: No foreign body, discharge or hordeolum.        Left eye: No foreign body, discharge or hordeolum.     Extraocular Movements: Extraocular movements intact.     Conjunctiva/sclera: Conjunctivae normal.     Pupils: Pupils are equal, round, and reactive to light.  Neck:     Thyroid: No thyroid mass or thyromegaly.     Trachea: No tracheal deviation.  Cardiovascular:     Rate and Rhythm: Normal rate and regular rhythm.     Pulses:          Posterior tibial pulses are 2+ on the right side and 2+ on the left side.     Heart sounds: Normal heart sounds. No murmur heard.   No friction rub. No gallop.  Pulmonary:     Effort: Pulmonary effort is normal. No respiratory distress.     Breath sounds: Normal breath sounds. No decreased breath sounds, wheezing or rales.  Chest:     Chest wall: No tenderness.  Abdominal:     General: Abdomen is flat. Bowel sounds are normal. There is no distension.     Palpations: Abdomen is soft. There is no mass.     Tenderness: There is no abdominal tenderness. There is no guarding or rebound.     Hernia: There is no hernia in the left inguinal area.  Musculoskeletal:        General: No tenderness or deformity. Normal range of motion.     Cervical back: Full passive range of motion without pain, normal range of motion and neck supple.     Right lower leg: No edema.     Left lower leg: No edema.     Right foot: Normal range of motion.     Left foot: Normal range of motion.  Feet:     Right foot:     Skin integrity: No skin breakdown.     Left foot:     Skin integrity: No skin breakdown.  Lymphadenopathy:     Cervical: No cervical adenopathy.  Skin:    General: Skin is warm and dry.     Findings: No  erythema.  Neurological:     Mental Status: He is alert and oriented to person, place, and time.     Cranial Nerves: No cranial nerve deficit.     Sensory: Sensation is intact. No sensory deficit.     Motor: Motor function is intact. No abnormal muscle tone or seizure activity.     Coordination: Coordination is intact. Romberg  sign negative. Coordination normal. Finger-Nose-Finger Test and Heel to [facility] Test normal.     Gait: Gait is intact.     Deep Tendon Reflexes: Reflexes normal.     Reflex Scores:      Patellar reflexes are 2+ on the right side and 2+ on the left side. Psychiatric:        Attention and Perception: Attention normal.        Mood and Affect: Mood normal.        Speech: Speech normal.        Behavior: Behavior normal. Behavior is cooperative.        Thought Content: Thought content normal.        Cognition and Memory: Cognition and memory normal.        Judgment: Judgment normal.         Patient has been counseled extensively about nutrition and exercise as well as the importance of adherence with medications and regular follow-up. The patient was given clear instructions to go to ER or return to medical center if symptoms don't improve, worsen or new problems develop. The patient verbalized understanding.   Follow-up: No follow-ups on file.   Gildardo Pounds, FNP-BC [facility] [place], [place]   03/28/2021, 10:35 AM

## 2021-03-29 LAB — CMP14+EGFR
ALT: 12 IU/L (ref 0–44)
AST: 16 IU/L (ref 0–40)
Albumin/Globulin Ratio: 1.6 (ref 1.2–2.2)
Albumin: 4.6 g/dL (ref 3.8–4.8)
Alkaline Phosphatase: 94 IU/L (ref 44–121)
BUN/Creatinine Ratio: 8 — ABNORMAL LOW (ref 10–24)
BUN: 7 mg/dL — ABNORMAL LOW (ref 8–27)
Bilirubin Total: <0.2 mg/dL (ref 0.0–1.2)
CO2: 21 mmol/L (ref 20–29)
Calcium: 9.2 mg/dL (ref 8.6–10.2)
Chloride: 105 mmol/L (ref 96–106)
Creatinine, Ser: 0.9 mg/dL (ref 0.76–1.27)
Globulin, Total: 2.9 g/dL (ref 1.5–4.5)
Glucose: 79 mg/dL (ref 65–99)
Potassium: 4.1 mmol/L (ref 3.5–5.2)
Sodium: 144 mmol/L (ref 134–144)
Total Protein: 7.5 g/dL (ref 6.0–8.5)
eGFR: 97 mL/min/{1.73_m2} (ref >59)

## 2021-03-29 LAB — LIPID PANEL
Chol/HDL Ratio: 2.1 ratio (ref 0.0–5.0)
Cholesterol, Total: 227 mg/dL — ABNORMAL HIGH (ref 100–199)
HDL: 109 mg/dL (ref >39)
LDL Chol Calc (NIH): 109 mg/dL — ABNORMAL HIGH (ref 0–99)
Triglycerides: 51 mg/dL (ref 0–149)
VLDL Cholesterol Cal: 9 mg/dL (ref 5–40)

## 2021-03-29 LAB — CBC
Hematocrit: 39.2 % (ref 37.5–51.0)
Hemoglobin: 13.7 g/dL (ref 13.0–17.7)
MCH: 32.7 pg (ref 26.6–33.0)
MCHC: 34.9 g/dL (ref 31.5–35.7)
MCV: 94 fL (ref 79–97)
Platelets: 191 10*3/uL (ref 150–450)
RBC: 4.19 x10E6/uL (ref 4.14–5.80)
RDW: 12.7 % (ref 11.6–15.4)
WBC: 3.4 10*3/uL (ref 3.4–10.8)

## 2021-03-29 LAB — HEMOGLOBIN A1C
Est. average glucose Bld gHb Est-mCnc: 103 mg/dL
Hgb A1c MFr Bld: 5.2 % (ref 4.8–5.6)

## 2021-04-26 ENCOUNTER — Other Ambulatory Visit: Payer: Self-pay

## 2021-12-31 ENCOUNTER — Emergency Department (HOSPITAL_COMMUNITY)
Admission: EM | Admit: 2021-12-31 | Discharge: 2021-12-31 | Disposition: A | Discharge status: Home / Self Care | Payer: Medicaid Other | Attending: Emergency Medicine | Admitting: Emergency Medicine

## 2021-12-31 ENCOUNTER — Other Ambulatory Visit: Payer: Self-pay

## 2021-12-31 ENCOUNTER — Emergency Department (HOSPITAL_COMMUNITY): Payer: Medicaid Other

## 2021-12-31 ENCOUNTER — Encounter (HOSPITAL_COMMUNITY): Payer: Self-pay

## 2021-12-31 DIAGNOSIS — M25551 Pain in right hip: Secondary | ICD-10-CM | POA: Diagnosis present

## 2021-12-31 MED ORDER — IBUPROFEN 800 MG PO TABS
800.0000 mg | ORAL_TABLET | Freq: Once | ORAL | Status: AC
  Administered 2021-12-31: 800 mg via ORAL
  Filled 2021-12-31: qty 1

## 2021-12-31 MED ORDER — MELOXICAM 15 MG PO TABS
15.0000 mg | ORAL_TABLET | Freq: Every day | ORAL | 0 refills | Status: AC
  Filled 2021-12-31: qty 15, 15d supply, fill #0

## 2021-12-31 NOTE — ED Triage Notes (Signed)
Chronic right hip pain for years per pt that worsened on Sunday. EMS reports pt was stable walking a few steps at home using a walker. Denies fall/injury. Full ROM to BLE. No swelling or obvious deformity.

## 2021-12-31 NOTE — Progress Notes (Signed)
Orthopedic Tech Progress Note Patient Details:  [name] [birth year] [id number]  Ortho Devices Type of Ortho Device: Crutches Ortho Device/Splint Interventions: Ordered, Application, Adjustment   Post Interventions Patient Tolerated: Well, Ambulated well Instructions Provided: Care of device  [name] 12/31/2021, 10:44 AM

## 2021-12-31 NOTE — Discharge Instructions (Addendum)
Follow up with your Physician for recheck.  Return if any problems.  

## 2021-12-31 NOTE — ED Provider Notes (Signed)
[facility] EMERGENCY DEPARTMENT Provider Note   CSN: [id number] Arrival date & time: 12/31/21  4098     History  Chief Complaint  Patient presents with   Hip Pain    [name] is a 66 y.o. male.  Patient complains of pain in his right hip down his right leg.  Patient states that a week ago he had pain in his left hip.  Patient reports he has pain on and off in his hips and his knees.  Patient denies any recent injury he has not had any falls.  Patient states that he thinks the pain is because when he was younger he was frequently incarcerated and had to sleep on hard beds.  He is not currently taking anything for pain.  Patient has past medical history of hepatitis C and schizophrenia.  The history is provided by the patient. No language interpreter was used.  Hip Pain This is a new problem. The current episode started more than 2 days ago. The problem occurs constantly. The problem has been gradually worsening. Nothing aggravates the symptoms. Nothing relieves the symptoms. He has tried nothing for the symptoms. The treatment provided no relief.      Home Medications Prior to Admission medications   Not on File      Allergies    Patient has no known allergies.    Review of Systems   Review of Systems  All other systems reviewed and are negative.  Physical Exam Updated Vital Signs BP (!) 154/100 (BP Location: Right Arm)   Pulse 88   Temp 97.8 F (36.6 C) (Oral)   Resp 15   SpO2 100%  Physical Exam Vitals and nursing note reviewed.  Constitutional:      General: He is not in acute distress.    Appearance: He is well-developed.  HENT:     Head: Normocephalic and atraumatic.  Cardiovascular:     Rate and Rhythm: Normal rate and regular rhythm.     Heart sounds: No murmur heard. Pulmonary:     Effort: Pulmonary effort is normal. No respiratory distress.     Breath sounds: Normal breath sounds.  Musculoskeletal:        General: Tenderness  present. No swelling or deformity.     Cervical back: Neck supple.     Right lower leg: No edema.     Left lower leg: No edema.     Comments: Tender right hip to palpation pain with range of motion, no deformity neurovascular neurosensory are intact  Skin:    General: Skin is warm and dry.     Capillary Refill: Capillary refill takes less than 2 seconds.  Neurological:     Mental Status: He is alert.  Psychiatric:        Mood and Affect: Mood normal.    ED Results / Procedures / Treatments   Labs (all labs ordered are listed, but only abnormal results are displayed) Labs Reviewed - No data to display  EKG None  Radiology DG Pelvis 1-2 Views  Result Date: 12/31/2021 CLINICAL DATA:  Bilateral hip pain. EXAM: PELVIS - 1-2 VIEW COMPARISON:  None Available. FINDINGS: Hips are located. No significant arthropathy of the hip joints. Mild sclerosis of the acetabular roofs. SI joints are normal. No pelvic bone abnormality. IMPRESSION: No acute osseous abnormality. Electronically Signed   By: Genevive Bi M.D.   On: 12/31/2021 09:31    Procedures Procedures    Medications Ordered in ED Medications  ibuprofen (  ADVIL) tablet 800 mg (800 mg Oral Given 12/31/21 1610)    ED Course/ Medical Decision Making/ A&P                          Differential diagnoses:  degenerative arthritis, bursitis, Muscle strain  Medical Decision Making Amount and/or Complexity of Data Reviewed External Data Reviewed: notes.    Details: Patient is followed by [facility] health and wellness.  Charts from [facility] health and wellness reviewed Radiology: ordered and independent interpretation performed. Decision-making details documented in ED Course.    Details: X-ray of pelvis shows no acute abnormality  Risk Prescription drug management. Risk Details: Patient has a history of having pain in his left hip which resolved and now has pain in his right hip.  Symptoms most likely are musculoskeletal x-ray does not show  evidence of significant degenerative disease.  Patient given ibuprofen here in the emergency department.  I will try patient on meloxicam to help with symptoms he is advised to follow back up with community health and wellness for recheck           Final Clinical Impression(s) / ED Diagnoses Final diagnoses:  Right hip pain    Rx / DC Orders ED Discharge Orders          Ordered    meloxicam (MOBIC) 15 MG tablet  Daily        12/31/21 0953          An After Visit Summary was printed and given to the patient.     Elson Areas, [place] 12/31/21 9604    Gloris Manchester, MD 01/03/22 [phone]

## 2022-01-07 ENCOUNTER — Other Ambulatory Visit: Payer: Self-pay

## 2022-08-27 ENCOUNTER — Encounter (HOSPITAL_COMMUNITY): Payer: Self-pay | Admitting: Emergency Medicine

## 2022-08-27 ENCOUNTER — Other Ambulatory Visit: Payer: Self-pay

## 2022-08-27 ENCOUNTER — Emergency Department (HOSPITAL_COMMUNITY): Payer: Medicaid Other

## 2022-08-27 ENCOUNTER — Emergency Department (HOSPITAL_COMMUNITY)
Admission: EM | Admit: 2022-08-27 | Discharge: 2022-08-27 | Disposition: A | Discharge status: Home / Self Care | Payer: Medicaid Other | Attending: Emergency Medicine | Admitting: Emergency Medicine

## 2022-08-27 DIAGNOSIS — S0081XA Abrasion of other part of head, initial encounter: Secondary | ICD-10-CM | POA: Insufficient documentation

## 2022-08-27 DIAGNOSIS — W010XXA Fall on same level from slipping, tripping and stumbling without subsequent striking against object, initial encounter: Secondary | ICD-10-CM | POA: Diagnosis not present

## 2022-08-27 DIAGNOSIS — S4991XA Unspecified injury of right shoulder and upper arm, initial encounter: Secondary | ICD-10-CM | POA: Diagnosis present

## 2022-08-27 DIAGNOSIS — R519 Headache, unspecified: Secondary | ICD-10-CM | POA: Insufficient documentation

## 2022-08-27 DIAGNOSIS — S42001A Fracture of unspecified part of right clavicle, initial encounter for closed fracture: Secondary | ICD-10-CM | POA: Diagnosis not present

## 2022-08-27 DIAGNOSIS — W19XXXA Unspecified fall, initial encounter: Secondary | ICD-10-CM

## 2022-08-27 MED ORDER — IBUPROFEN 400 MG PO TABS
600.0000 mg | ORAL_TABLET | Freq: Once | ORAL | Status: AC
  Administered 2022-08-27: 600 mg via ORAL
  Filled 2022-08-27: qty 1

## 2022-08-27 NOTE — ED Triage Notes (Signed)
Pt was drinking alcohol and tripped going up stairs last night and fell onto right shoulder. Pt pain radiates up to right side of neck.

## 2022-08-27 NOTE — ED Notes (Signed)
Shoulder sling applied.

## 2022-08-27 NOTE — Discharge Instructions (Signed)
You were seen in the emergency department for pain in your head neck and right shoulder after a fall.  You had CAT scan of your head and neck that was okay but the x-rays of your right shoulder showed a clavicle fracture.  This is also known as a collarbone fracture.  These will usually heal on their own but you should follow-up with orthopedics.  Sling.  Ice to the affected area.  Tylenol and ibuprofen for pain.  Return to the emergency department if any worsening or concerning symptoms.

## 2022-08-27 NOTE — ED Provider Triage Note (Signed)
Emergency Medicine Provider Triage Evaluation Note  [name] , a 67 y.o. male  was evaluated in triage.  Pt complains of right clavicular pain.  Patient reports that he was drinking alcohol last night and fell and lost consciousness.  He complains of some right-sided neck pain as well.  No blood thinner use.  Review of Systems  Positive:  Negative:   Physical Exam  BP (!) 155/95 (BP Location: Left Arm)   Pulse (!) 107   Temp 98.9 F (37.2 C)   Resp 18   Wt 63.5 kg   SpO2 99%   BMI 24.03 kg/m  Gen:   Awake, no distress   Resp:  Normal effort  MSK:   Moves extremities without difficulty  Other:  Mobile pulse in the right extremity.  No tenderness palpation of the wrist, forearm, elbow, or humerus.  Some tenderness over the right shoulder but mainly over the clavicle area.  No obvious deformity but there is some diffuse tenderness palpation.  Some tenderness palpation to the right side of the neck as well.  Cranial nerves grossly intact. Medical Decision Making  Medically screening exam initiated at 1:40 PM.  Appropriate orders placed.  Dohn Stclair was informed that the remainder of the evaluation will be completed by another provider, this initial triage assessment does not replace that evaluation, and the importance of remaining in the ED until their evaluation is complete.  The patient's EtOH, will order head CT and cervical spine as well as right shoulder and clavicle.   Sherrell Puller, [place] 08/27/22 1342

## 2022-08-27 NOTE — ED Provider Notes (Signed)
[facility] EMERGENCY DEPARTMENT Provider Note   CSN: [id number] Arrival date & time: 08/27/22  1314     History  Chief Complaint  Patient presents with   Shoulder Pain    [name] is a 67 y.o. male.  He is here with a complaint of right shoulder pain after he tripped and fell walking up the stairs last evening.  He admits to some alcohol last night.  He hit his head and has some abrasions on his face.  He denies any chest pain abdominal pain numbness.  He is complaining of inability to raise his arm up but it seems like pain is limiting him on the right.  He is right-hand dominant.  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  Shoulder Pain Location:  Clavicle and shoulder Clavicle location:  R clavicle Shoulder location:  R shoulder Injury: yes   Time since incident:  1 day Mechanism of injury: fall   Pain details:    Quality:  Throbbing   Severity:  Moderate   Onset quality:  Sudden   Progression:  Unchanged Handedness:  Right-handed Dislocation: no   Tetanus status:  Up to date Relieved by:  None tried Worsened by:  Movement Ineffective treatments:  None tried Associated symptoms: decreased range of motion and neck pain   Associated symptoms: no fever, no numbness and no tingling        Home Medications Prior to Admission medications   Medication Sig Start Date End Date Taking? Authorizing Provider  meloxicam (MOBIC) 15 MG tablet Take 1 tablet (15 mg total) by mouth daily. 12/31/21 12/31/22  Elson Areas, PA-C      Allergies    Patient has no known allergies.    Review of Systems   Review of Systems  Constitutional:  Negative for fever.  Eyes:  Negative for visual disturbance.  Respiratory:  Negative for shortness of breath.   Cardiovascular:  Negative for chest pain.  Musculoskeletal:  Positive for neck pain.  Skin:  Positive for wound.  Neurological:  Positive for  headaches.    Physical Exam Updated Vital Signs BP (!) 155/116 (BP Location: Left Arm)   Pulse (!) 107   Temp 98.4 F (36.9 C) (Oral)   Resp 18   Wt 63.5 kg   SpO2 100%   BMI 24.03 kg/m  Physical Exam Vitals and nursing note reviewed.  Constitutional:      General: He is not in acute distress.    Appearance: Normal appearance. He is well-developed.  HENT:     Head: Normocephalic.     Comments: He has a few abrasions on his face Eyes:     Conjunctiva/sclera: Conjunctivae normal.  Cardiovascular:     Rate and Rhythm: Normal rate and regular rhythm.     Heart sounds: No murmur heard. Pulmonary:     Effort: Pulmonary effort is normal. No respiratory distress.     Breath sounds: Normal breath sounds.  Abdominal:     Palpations: Abdomen is soft.     Tenderness: There is no abdominal tenderness. There is no guarding or rebound.  Musculoskeletal:        General: Tenderness and signs of injury present. No swelling.     Cervical back: Neck supple.     Comments: He is diffusely tender about his right shoulder although more significantly on the distal clavicle.  He has normal internal/external rotation normal biceps  flexion extension elbow wrist hand nontender.  Distal pulses motor and sensation intact.  Other extremities full range of motion without any pain or limitations.  Skin:    General: Skin is warm and dry.     Capillary Refill: Capillary refill takes less than 2 seconds.  Neurological:     General: No focal deficit present.     Mental Status: He is alert and oriented to person, place, and time.     Cranial Nerves: No cranial nerve deficit.     Sensory: No sensory deficit.     Motor: No weakness.     ED Results / Procedures / Treatments   Labs (all labs ordered are listed, but only abnormal results are displayed) Labs Reviewed - No data to display  EKG None  Radiology CT Head Wo Contrast  Result Date: 08/27/2022 CLINICAL DATA:  Head trauma, moderate-severe;  Neck trauma, dangerous injury mechanism (Age 6-64y) EXAM: CT HEAD WITHOUT CONTRAST CT CERVICAL SPINE WITHOUT CONTRAST TECHNIQUE: Multidetector CT imaging of the head and cervical spine was performed following the standard protocol without intravenous contrast. Multiplanar CT image reconstructions of the cervical spine were also generated. RADIATION DOSE REDUCTION: This exam was performed according to the departmental dose-optimization program which includes automated exposure control, adjustment of the mA and/or kV according to patient size and/or use of iterative reconstruction technique. COMPARISON:  None Available. FINDINGS: CT HEAD FINDINGS Brain: No evidence of acute infarction, hemorrhage, hydrocephalus, extra-axial collection or mass lesion/mass effect. Vascular: No hyperdense vessel identified. Skull: No acute fracture. Sinuses/Orbits: Right sphenoid sinus air-fluid level. No acute orbital findings. Other: No mastoid effusions. CT CERVICAL SPINE FINDINGS Alignment: No substantial sagittal subluxation. Skull base and vertebrae: No acute fracture. Vertebral body heights are maintained. Soft tissues and spinal canal: No prevertebral fluid or swelling. No visible canal hematoma. Disc levels:  No significant focal bony degenerative change. Upper chest: Visualized lung apices are clear. IMPRESSION: 1. No evidence of acute intracranial abnormality. 2. No evidence of acute fracture or traumatic malalignment in the cervical spine. Electronically Signed   By: Margaretha Sheffield M.D.   On: 08/27/2022 14:28   CT Cervical Spine Wo Contrast  Result Date: 08/27/2022 CLINICAL DATA:  Head trauma, moderate-severe; Neck trauma, dangerous injury mechanism (Age 64-64y) EXAM: CT HEAD WITHOUT CONTRAST CT CERVICAL SPINE WITHOUT CONTRAST TECHNIQUE: Multidetector CT imaging of the head and cervical spine was performed following the standard protocol without intravenous contrast. Multiplanar CT image reconstructions of the  cervical spine were also generated. RADIATION DOSE REDUCTION: This exam was performed according to the departmental dose-optimization program which includes automated exposure control, adjustment of the mA and/or kV according to patient size and/or use of iterative reconstruction technique. COMPARISON:  None Available. FINDINGS: CT HEAD FINDINGS Brain: No evidence of acute infarction, hemorrhage, hydrocephalus, extra-axial collection or mass lesion/mass effect. Vascular: No hyperdense vessel identified. Skull: No acute fracture. Sinuses/Orbits: Right sphenoid sinus air-fluid level. No acute orbital findings. Other: No mastoid effusions. CT CERVICAL SPINE FINDINGS Alignment: No substantial sagittal subluxation. Skull base and vertebrae: No acute fracture. Vertebral body heights are maintained. Soft tissues and spinal canal: No prevertebral fluid or swelling. No visible canal hematoma. Disc levels:  No significant focal bony degenerative change. Upper chest: Visualized lung apices are clear. IMPRESSION: 1. No evidence of acute intracranial abnormality. 2. No evidence of acute fracture or traumatic malalignment in the cervical spine. Electronically Signed   By: Margaretha Sheffield M.D.   On: 08/27/2022 14:28   DG Clavicle Right  Result Date: 08/27/2022 CLINICAL DATA:  Fall with 5 cul pain. EXAM: RIGHT CLAVICLE - 2+ VIEWS COMPARISON:  None Available. FINDINGS: Two views study shows a comminuted fracture of the distal right clavicle . Coracoclavicular distance is preserved. IMPRESSION: Comminuted fracture of the distal right clavicle. Electronically Signed   By: Misty Stanley M.D.   On: 08/27/2022 14:08   DG Shoulder Right  Result Date: 08/27/2022 CLINICAL DATA:  Fall.  Right clavicle pain. EXAM: RIGHT SHOULDER - 2+ VIEW COMPARISON:  None Available. FINDINGS: Comminuted fracture of the distal clavicle evident. Acromioclavicular and coracoclavicular distances are preserved. No evidence for humeral head dislocation.  Visualized upper right ribs are unremarkable. IMPRESSION: Comminuted fracture of the distal right clavicle. Electronically Signed   By: Misty Stanley M.D.   On: 08/27/2022 14:06    Procedures Procedures    Medications Ordered in ED Medications - No data to display  ED Course/ Medical Decision Making/ A&P Clinical Course as of 08/28/22 0949  Thu Aug 27, 2022  1748 X-rays interpreted by me as right clavicle fracture. [MB]    Clinical Course User Index [MB] Hayden Rasmussen, MD                             Medical Decision Making Amount and/or Complexity of Data Reviewed Radiology: ordered.   This patient complains of right shoulder pain after fall; this involves an extensive number of treatment Options and is a complaint that carries with it a high risk of complications and morbidity. The differential includes fracture, contusion, dislocation, hematoma  I ordered medication ibuprofen and reviewed PMP when indicated. I ordered imaging studies which included CT head and neck, x-rays of right shoulder and clavicle and I independently    visualized and interpreted imaging which showed distal clavicle fracture Previous records obtained and reviewed in epic no recent admissions  Social determinants considered, no significant barriers Critical Interventions: None  After the interventions stated above, I reevaluated the patient and found patient to be neurovascularly intact with improved pain after placing in sling Admission and further testing considered, no indications for admission at this time.  Given outpatient follow-up information.  Return instructions discussed.         Final Clinical Impression(s) / ED Diagnoses Final diagnoses:  Closed nondisplaced fracture of right clavicle, unspecified part of clavicle, initial encounter  Fall, initial encounter    Rx / DC Orders ED Discharge Orders     None         Hayden Rasmussen, MD 08/28/22 [phone]

## 2022-08-27 NOTE — ED Triage Notes (Signed)
Pt states he was drinking a lot and may have blacked out causing the fall. Pt has abrasion to right side of face. Pt is not on blood thinners.

## 2022-10-02 ENCOUNTER — Other Ambulatory Visit (HOSPITAL_COMMUNITY): Payer: Self-pay

## 2023-06-04 ENCOUNTER — Encounter: Payer: Medicaid Other | Admitting: Internal Medicine

## 2023-09-08 ENCOUNTER — Other Ambulatory Visit: Payer: Self-pay

## 2023-12-29 ENCOUNTER — Telehealth (INDEPENDENT_AMBULATORY_CARE_PROVIDER_SITE_OTHER): Payer: Self-pay | Admitting: Primary Care

## 2023-12-29 NOTE — Telephone Encounter (Signed)
 Called to confirm appt. Pt's phone unavailable.

## 2023-12-30 ENCOUNTER — Encounter (INDEPENDENT_AMBULATORY_CARE_PROVIDER_SITE_OTHER): Payer: Self-pay | Admitting: Primary Care

## 2023-12-30 ENCOUNTER — Ambulatory Visit (INDEPENDENT_AMBULATORY_CARE_PROVIDER_SITE_OTHER): Admitting: Primary Care

## 2023-12-30 ENCOUNTER — Other Ambulatory Visit: Payer: Self-pay

## 2023-12-30 VITALS — BP 152/97 | HR 71 | Resp 16 | Ht 63.0 in | Wt 144.4 lb

## 2023-12-30 DIAGNOSIS — F1090 Alcohol use, unspecified, uncomplicated: Secondary | ICD-10-CM | POA: Diagnosis not present

## 2023-12-30 DIAGNOSIS — I10 Essential (primary) hypertension: Secondary | ICD-10-CM | POA: Diagnosis not present

## 2023-12-30 DIAGNOSIS — M25511 Pain in right shoulder: Secondary | ICD-10-CM | POA: Diagnosis not present

## 2023-12-30 DIAGNOSIS — K056 Periodontal disease, unspecified: Secondary | ICD-10-CM | POA: Diagnosis not present

## 2023-12-30 DIAGNOSIS — G8929 Other chronic pain: Secondary | ICD-10-CM

## 2023-12-30 MED ORDER — LISINOPRIL-HYDROCHLOROTHIAZIDE 20-25 MG PO TABS
1.0000 | ORAL_TABLET | Freq: Every day | ORAL | 3 refills | Status: DC
  Filled 2023-12-30 (×2): qty 90, 90d supply, fill #0

## 2023-12-30 NOTE — Progress Notes (Signed)
 [facility]  [place], is a 68 y.o. male  [id number]  [id number]  DOB - 04/26/1956  Chief Complaint  Patient presents with   New Patient (Initial Visit)   Shoulder Pain    Use to see a joint specialist        Subjective:   [name] is a 68 y.o. male here today for an acute visit.  Patient was misinformed by his PCP Zelda Flemings was no longer at [facility] him.  Therefore, he was switching providers to [facility].  He has complaints of shoulder pain from a injury and now he is having complications with his arm and hands and having difficulty trying to grasp objects and would like to be referred to a specialist. He is exercising his right arm. He admits to continue to drink alcohol.  On his days off he may have 4 beers or 2-40 ounces. Patient also needs to have teeth removed due to poor dental caries requesting a referral.  Will provide patient with a list of dentist that will take Medicaid and he can schedule an appointment.  No problems updated.  Comprehensive ROS Pertinent positive and negative noted in HPI   No Known Allergies  Past Medical History:  Diagnosis Date   Hepatitis C    History of rib fracture     No current outpatient medications on file prior to visit.   No current facility-administered medications on file prior to visit.   Health Maintenance  Topic Date Due   Medicare Annual Wellness Visit  Never done   Colon Cancer Screening  Never done   Zoster (Shingles) Vaccine (1 of 2) Never done   Pneumonia Vaccine (2 of 2 - PCV) 04/14/2019   Stool Blood Test  10/25/2020   COVID-19 Vaccine (3 - 2024-25 season) 04/11/2023   Flu Shot  03/10/2024   DTaP/Tdap/Td vaccine (3 - Td or Tdap) 10/15/2027   Hepatitis C Screening  Completed   HIV Screening  Completed   HPV Vaccine  Aged Out   Meningitis B Vaccine  Aged Out    Objective:   Vitals:   12/30/23 1417 12/30/23 1419  BP: (!) 170/96 (!)  152/97  Pulse: 71   Resp: 16   SpO2: 100%   Weight: 144 lb 6.4 oz (65.5 kg)   Height: 5\' 3"  (1.6 m)    BP Readings from Last 3 Encounters:  12/30/23 (!) 152/97  08/27/22 (!) 150/114  12/31/21 (!) 121/97      Physical Exam Vitals reviewed.  HENT:     Head: Normocephalic.     Right Ear: Tympanic membrane and external ear normal.     Left Ear: Tympanic membrane and external ear normal.     Nose: Nose normal.     Mouth/Throat:     Comments: Poor dentition  Eyes:     Extraocular Movements: Extraocular movements intact.  Cardiovascular:     Rate and Rhythm: Normal rate and regular rhythm.  Pulmonary:     Effort: Pulmonary effort is normal.     Breath sounds: Normal breath sounds.  Abdominal:     General: Bowel sounds are normal. There is distension.     Palpations: Abdomen is soft.  Musculoskeletal:        General: Normal range of motion.     Cervical back: Normal range of motion.  Skin:    General: Skin is warm and dry.  Neurological:     Mental Status: He is oriented  to person, place, and time.  Psychiatric:        Mood and Affect: Mood normal.        Behavior: Behavior normal.     Assessment & Plan  [name] was seen today for new patient (initial visit) and shoulder pain.  Diagnoses and all orders for this visit:  Essential hypertension BP goal - <140/90 Explained that having normal blood pressure is the goal and medications are helping to get to goal and maintain normal blood pressure. DIET: Limit salt intake, read nutrition labels to check salt content, limit fried and high fatty foods  Avoid using multisymptom OTC cold preparations that generally contain sudafed which can rise BP. Consult with pharmacist on best cold relief products to use for persons with HTN EXERCISE Discussed incorporating exercise such as walking - 30 minutes most days of the week and can do in 10 minute intervals    -     lisinopril-hydrochlorothiazide (ZESTORETIC) 20-25 MG tablet; Take 1  tablet by mouth daily.  Chronic right shoulder pain Refer to orthopedics  Periodontal disease Poor dentition missing teeth  Provide dental information to call that takes his insurance   Alcohol use Admits prior to appt having a shots than a 1/2 of bottle of liquor    Patient have been counseled extensively about nutrition and exercise. Other issues discussed during this visit include: low cholesterol diet, weight control and daily exercise, foot care, annual eye examinations at Ophthalmology, importance of adherence with medications and regular follow-up. We also discussed long term complications of uncontrolled diabetes and hypertension.   Return for PCP f/u BP on medication Ms. Flemmings.  The patient was given clear instructions to go to ER or return to medical center if symptoms don't improve, worsen or new problems develop. The patient verbalized understanding. The patient was told to call to get lab results if they haven't heard anything in the next week.   This note has been created with Education officer, environmental. Any transcriptional errors are unintentional.   Marius Siemens, NP 12/30/2023, 2:59 PM

## 2023-12-30 NOTE — Patient Instructions (Signed)
 Hypertension, Adult Hypertension is another name for high blood pressure. High blood pressure forces your heart to work harder to pump blood. This can cause problems over time. There are two numbers in a blood pressure reading. There is a top number (systolic) over a bottom number (diastolic). It is best to have a blood pressure that is below 120/80. What are the causes? The cause of this condition is not known. Some other conditions can lead to high blood pressure. What increases the risk? Some lifestyle factors can make you more likely to develop high blood pressure: Smoking. Not getting enough exercise or physical activity. Being overweight. Having too much fat, sugar, calories, or salt (sodium) in your diet. Drinking too much alcohol. Other risk factors include: Having any of these conditions: Heart disease. Diabetes. High cholesterol. Kidney disease. Obstructive sleep apnea. Having a family history of high blood pressure and high cholesterol. Age. The risk increases with age. Stress. What are the signs or symptoms? High blood pressure may not cause symptoms. Very high blood pressure (hypertensive crisis) may cause: Headache. Fast or uneven heartbeats (palpitations). Shortness of breath. Nosebleed. Vomiting or feeling like you may vomit (nauseous). Changes in how you see. Very bad chest pain. Feeling dizzy. Seizures. How is this treated? This condition is treated by making healthy lifestyle changes, such as: Eating healthy foods. Exercising more. Drinking less alcohol. Your doctor may prescribe medicine if lifestyle changes do not help enough and if: Your top number is above 130. Your bottom number is above 80. Your personal target blood pressure may vary. Follow these instructions at home: Eating and drinking  If told, follow the DASH eating plan. To follow this plan: Fill one half of your plate at each meal with fruits and vegetables. Fill one fourth of your plate  at each meal with whole grains. Whole grains include whole-wheat pasta, brown rice, and whole-grain bread. Eat or drink low-fat dairy products, such as skim milk or low-fat yogurt. Fill one fourth of your plate at each meal with low-fat (lean) proteins. Low-fat proteins include fish, chicken without skin, eggs, beans, and tofu. Avoid fatty meat, cured and processed meat, or chicken with skin. Avoid pre-made or processed food. Limit the amount of salt in your diet to less than 1,500 mg each day. Do not drink alcohol if: Your doctor tells you not to drink. You are pregnant, may be pregnant, or are planning to become pregnant. If you drink alcohol: Limit how much you have to: 0-1 drink a day for women. 0-2 drinks a day for men. Know how much alcohol is in your drink. In the [place] one drink equals one 12 oz bottle of beer (355 mL), one 5 oz glass of wine (148 mL), or one 1 oz glass of hard liquor (44 mL). Lifestyle  Work with your doctor to stay at a healthy weight or to lose weight. Ask your doctor what the best weight is for you. Get at least 30 minutes of exercise that causes your heart to beat faster (aerobic exercise) most days of the week. This may include walking, swimming, or biking. Get at least 30 minutes of exercise that strengthens your muscles (resistance exercise) at least 3 days a week. This may include lifting weights or doing Pilates. Do not smoke or use any products that contain nicotine or tobacco. If you need help quitting, ask your doctor. Check your blood pressure at home as told by your doctor. Keep all follow-up visits. Medicines Take over-the-counter and prescription medicines  only as told by your doctor. Follow directions carefully. Do not skip doses of blood pressure medicine. The medicine does not work as well if you skip doses. Skipping doses also puts you at risk for problems. Ask your doctor about side effects or reactions to medicines that you should watch  for. Contact a doctor if: You think you are having a reaction to the medicine you are taking. You have headaches that keep coming back. You feel dizzy. You have swelling in your ankles. You have trouble with your vision. Get help right away if: You get a very bad headache. You start to feel mixed up (confused). You feel weak or numb. You feel faint. You have very bad pain in your: Chest. Belly (abdomen). You vomit more than once. You have trouble breathing. These symptoms may be an emergency. Get help right away. Call 911. Do not wait to see if the symptoms will go away. Do not drive yourself to the hospital. Summary Hypertension is another name for high blood pressure. High blood pressure forces your heart to work harder to pump blood. For most people, a normal blood pressure is less than 120/80. Making healthy choices can help lower blood pressure. If your blood pressure does not get lower with healthy choices, you may need to take medicine. This information is not intended to replace advice given to you by your health care provider. Make sure you discuss any questions you have with your health care provider. Document Revised: 05/15/2021 Document Reviewed: 05/15/2021 Elsevier Patient Education  2024 [employer].

## 2024-01-11 ENCOUNTER — Other Ambulatory Visit: Payer: Self-pay

## 2024-02-07 ENCOUNTER — Telehealth: Payer: Self-pay | Admitting: Nurse Practitioner

## 2024-02-07 NOTE — Telephone Encounter (Signed)
 Contacted pt no vm no dial tone to confirmed appt phone out of service

## 2024-02-08 ENCOUNTER — Encounter: Payer: Self-pay | Admitting: Nurse Practitioner

## 2024-02-08 ENCOUNTER — Ambulatory Visit: Attending: Nurse Practitioner | Admitting: Nurse Practitioner

## 2024-02-08 VITALS — BP 121/76 | HR 97 | Resp 19 | Ht 63.0 in | Wt 143.2 lb

## 2024-02-08 DIAGNOSIS — R7989 Other specified abnormal findings of blood chemistry: Secondary | ICD-10-CM

## 2024-02-08 DIAGNOSIS — Z1211 Encounter for screening for malignant neoplasm of colon: Secondary | ICD-10-CM

## 2024-02-08 DIAGNOSIS — R350 Frequency of micturition: Secondary | ICD-10-CM

## 2024-02-08 DIAGNOSIS — I10 Essential (primary) hypertension: Secondary | ICD-10-CM | POA: Diagnosis not present

## 2024-02-08 DIAGNOSIS — E7800 Pure hypercholesterolemia, unspecified: Secondary | ICD-10-CM | POA: Diagnosis not present

## 2024-02-08 DIAGNOSIS — H5712 Ocular pain, left eye: Secondary | ICD-10-CM | POA: Diagnosis not present

## 2024-02-08 DIAGNOSIS — Z23 Encounter for immunization: Secondary | ICD-10-CM

## 2024-02-08 DIAGNOSIS — K089 Disorder of teeth and supporting structures, unspecified: Secondary | ICD-10-CM | POA: Diagnosis not present
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
## 2024-02-08 NOTE — Patient Instructions (Signed)
 [facility] [address]  [phone]

## 2024-02-09 LAB — CBC WITH DIFFERENTIAL/PLATELET
Basophils Absolute: 0 10*3/uL (ref 0.0–0.2)
Basos: 1 %
EOS (ABSOLUTE): 0 10*3/uL (ref 0.0–0.4)
Eos: 1 %
Hematocrit: 39.3 % (ref 37.5–51.0)
Hemoglobin: 13.3 g/dL (ref 13.0–17.7)
Immature Grans (Abs): 0 10*3/uL (ref 0.0–0.1)
Immature Granulocytes: 0 %
Lymphocytes Absolute: 0.9 10*3/uL (ref 0.7–3.1)
Lymphs: 25 %
MCH: 32.4 pg (ref 26.6–33.0)
MCHC: 33.8 g/dL (ref 31.5–35.7)
MCV: 96 fL (ref 79–97)
Monocytes Absolute: 0.3 10*3/uL (ref 0.1–0.9)
Monocytes: 8 %
Neutrophils Absolute: 2.5 10*3/uL (ref 1.4–7.0)
Neutrophils: 65 %
Platelets: 229 10*3/uL (ref 150–450)
RBC: 4.11 x10E6/uL — ABNORMAL LOW (ref 4.14–5.80)
RDW: 12.3 % (ref 11.6–15.4)
WBC: 3.8 10*3/uL (ref 3.4–10.8)

## 2024-02-09 LAB — LIPID PANEL
Chol/HDL Ratio: 2 ratio (ref 0.0–5.0)
Cholesterol, Total: 212 mg/dL — ABNORMAL HIGH (ref 100–199)
HDL: 106 mg/dL (ref >39)
LDL Chol Calc (NIH): 94 mg/dL (ref 0–99)
Triglycerides: 68 mg/dL (ref 0–149)
VLDL Cholesterol Cal: 12 mg/dL (ref 5–40)

## 2024-02-09 LAB — CMP14+EGFR
ALT: 10 IU/L (ref 0–44)
AST: 13 IU/L (ref 0–40)
Albumin: 4.5 g/dL (ref 3.9–4.9)
Alkaline Phosphatase: 98 IU/L (ref 44–121)
BUN/Creatinine Ratio: 8 — ABNORMAL LOW (ref 10–24)
BUN: 7 mg/dL — ABNORMAL LOW (ref 8–27)
Bilirubin Total: 0.3 mg/dL (ref 0.0–1.2)
CO2: 23 mmol/L (ref 20–29)
Calcium: 10 mg/dL (ref 8.6–10.2)
Chloride: 104 mmol/L (ref 96–106)
Creatinine, Ser: 0.88 mg/dL (ref 0.76–1.27)
Globulin, Total: 2.7 g/dL (ref 1.5–4.5)
Glucose: 83 mg/dL (ref 70–99)
Potassium: 3.8 mmol/L (ref 3.5–5.2)
Sodium: 142 mmol/L (ref 134–144)
Total Protein: 7.2 g/dL (ref 6.0–8.5)
eGFR: 95 mL/min/{1.73_m2} (ref >59)

## 2024-02-09 LAB — PSA: Prostate Specific Ag, Serum: 0.3 ng/mL (ref 0.0–4.0)

## 2024-02-10 ENCOUNTER — Ambulatory Visit: Payer: Self-pay | Admitting: Nurse Practitioner

## 2024-03-24 ENCOUNTER — Encounter: Payer: Self-pay | Admitting: Nurse Practitioner

## 2024-04-03 ENCOUNTER — Encounter: Payer: Self-pay | Admitting: Gastroenterology

## 2024-04-13 ENCOUNTER — Telehealth: Payer: Self-pay

## 2024-04-13 ENCOUNTER — Encounter

## 2024-04-13 NOTE — Telephone Encounter (Signed)
 Due to patient missing pre visit appt. Letter has been sent to patient informing him of  cancellation for his colonoscopy.

## 2024-04-13 NOTE — Telephone Encounter (Signed)
 Called patient twice to see if he was coming to his in person previsit. Unable to leave voicemail. Phone rings once then a busy signal, both times.

## 2024-05-01 ENCOUNTER — Encounter: Admitting: Gastroenterology

## 2024-05-10 ENCOUNTER — Ambulatory Visit: Admitting: Nurse Practitioner

## 2024-06-10 ENCOUNTER — Emergency Department (HOSPITAL_COMMUNITY)

## 2024-06-10 ENCOUNTER — Observation Stay (HOSPITAL_COMMUNITY)
Admission: EM | Admit: 2024-06-10 | Discharge: 2024-06-10 | Disposition: A | Discharge status: Home / Self Care | Attending: Internal Medicine | Admitting: Internal Medicine

## 2024-06-10 ENCOUNTER — Other Ambulatory Visit: Payer: Self-pay

## 2024-06-10 ENCOUNTER — Inpatient Hospital Stay (HOSPITAL_BASED_OUTPATIENT_CLINIC_OR_DEPARTMENT_OTHER)

## 2024-06-10 ENCOUNTER — Encounter (HOSPITAL_COMMUNITY): Payer: Self-pay | Admitting: Internal Medicine

## 2024-06-10 DIAGNOSIS — I213 ST elevation (STEMI) myocardial infarction of unspecified site: Secondary | ICD-10-CM | POA: Diagnosis not present

## 2024-06-10 DIAGNOSIS — Z8619 Personal history of other infectious and parasitic diseases: Secondary | ICD-10-CM | POA: Diagnosis not present

## 2024-06-10 DIAGNOSIS — I2510 Atherosclerotic heart disease of native coronary artery without angina pectoris: Secondary | ICD-10-CM | POA: Diagnosis not present

## 2024-06-10 DIAGNOSIS — R079 Chest pain, unspecified: Principal | ICD-10-CM | POA: Diagnosis present

## 2024-06-10 DIAGNOSIS — I499 Cardiac arrhythmia, unspecified: Secondary | ICD-10-CM | POA: Diagnosis not present

## 2024-06-10 DIAGNOSIS — F1010 Alcohol abuse, uncomplicated: Secondary | ICD-10-CM | POA: Insufficient documentation

## 2024-06-10 DIAGNOSIS — F1490 Cocaine use, unspecified, uncomplicated: Secondary | ICD-10-CM | POA: Insufficient documentation

## 2024-06-10 DIAGNOSIS — R0602 Shortness of breath: Secondary | ICD-10-CM | POA: Diagnosis not present

## 2024-06-10 DIAGNOSIS — I10 Essential (primary) hypertension: Secondary | ICD-10-CM | POA: Insufficient documentation

## 2024-06-10 DIAGNOSIS — E785 Hyperlipidemia, unspecified: Secondary | ICD-10-CM | POA: Diagnosis not present

## 2024-06-10 DIAGNOSIS — E8721 Acute metabolic acidosis: Secondary | ICD-10-CM | POA: Diagnosis not present

## 2024-06-10 DIAGNOSIS — I700 Atherosclerosis of aorta: Secondary | ICD-10-CM | POA: Diagnosis not present

## 2024-06-10 DIAGNOSIS — N400 Enlarged prostate without lower urinary tract symptoms: Secondary | ICD-10-CM | POA: Diagnosis not present

## 2024-06-10 DIAGNOSIS — R0789 Other chest pain: Principal | ICD-10-CM | POA: Insufficient documentation

## 2024-06-10 DIAGNOSIS — R231 Pallor: Secondary | ICD-10-CM | POA: Diagnosis not present

## 2024-06-10 DIAGNOSIS — F1410 Cocaine abuse, uncomplicated: Secondary | ICD-10-CM | POA: Diagnosis not present

## 2024-06-10 LAB — COMPREHENSIVE METABOLIC PANEL WITH GFR
ALT: 15 U/L (ref 0–44)
AST: 21 U/L (ref 15–41)
Albumin: 4.1 g/dL (ref 3.5–5.0)
Alkaline Phosphatase: 79 U/L (ref 38–126)
Anion gap: 16 — ABNORMAL HIGH (ref 5–15)
BUN: 5 mg/dL — ABNORMAL LOW (ref 8–23)
CO2: 22 mmol/L (ref 22–32)
Calcium: 9.3 mg/dL (ref 8.9–10.3)
Chloride: 103 mmol/L (ref 98–111)
Creatinine, Ser: 1.03 mg/dL (ref 0.61–1.24)
GFR, Estimated: >60 mL/min (ref >60)
Glucose, Bld: 117 mg/dL — ABNORMAL HIGH (ref 70–99)
Potassium: 4.3 mmol/L (ref 3.5–5.1)
Sodium: 141 mmol/L (ref 135–145)
Total Bilirubin: 0.2 mg/dL (ref 0.0–1.2)
Total Protein: 7.7 g/dL (ref 6.5–8.1)

## 2024-06-10 LAB — CBC WITH DIFFERENTIAL/PLATELET
Abs Immature Granulocytes: 0.01 K/uL (ref 0.00–0.07)
Basophils Absolute: 0 K/uL (ref 0.0–0.1)
Basophils Relative: 1 %
Eosinophils Absolute: 0 K/uL (ref 0.0–0.5)
Eosinophils Relative: 0 %
HCT: 40.3 % (ref 39.0–52.0)
Hemoglobin: 13.9 g/dL (ref 13.0–17.0)
Immature Granulocytes: 0 %
Lymphocytes Relative: 21 %
Lymphs Abs: 1 K/uL (ref 0.7–4.0)
MCH: 32 pg (ref 26.0–34.0)
MCHC: 34.5 g/dL (ref 30.0–36.0)
MCV: 92.6 fL (ref 80.0–100.0)
Monocytes Absolute: 0.4 K/uL (ref 0.1–1.0)
Monocytes Relative: 8 %
Neutro Abs: 3.2 K/uL (ref 1.7–7.7)
Neutrophils Relative %: 70 %
Platelets: 271 K/uL (ref 150–400)
RBC: 4.35 MIL/uL (ref 4.22–5.81)
RDW: 11.7 % (ref 11.5–15.5)
WBC: 4.5 K/uL (ref 4.0–10.5)
nRBC: 0 % (ref 0.0–0.2)

## 2024-06-10 LAB — I-STAT CHEM 8, ED
BUN: 4 mg/dL — ABNORMAL LOW (ref 8–23)
Calcium, Ion: 1.09 mmol/L — ABNORMAL LOW (ref 1.15–1.40)
Chloride: 106 mmol/L (ref 98–111)
Creatinine, Ser: 1.1 mg/dL (ref 0.61–1.24)
Glucose, Bld: 115 mg/dL — ABNORMAL HIGH (ref 70–99)
HCT: 41 % (ref 39.0–52.0)
Hemoglobin: 13.9 g/dL (ref 13.0–17.0)
Potassium: 4.3 mmol/L (ref 3.5–5.1)
Sodium: 142 mmol/L (ref 135–145)
TCO2: 23 mmol/L (ref 22–32)

## 2024-06-10 LAB — RAPID URINE DRUG SCREEN, HOSP PERFORMED
Amphetamines: NOT DETECTED
Barbiturates: NOT DETECTED
Benzodiazepines: NOT DETECTED
Cocaine: POSITIVE — AB
Opiates: POSITIVE — AB
Tetrahydrocannabinol: NOT DETECTED

## 2024-06-10 LAB — I-STAT CG4 LACTIC ACID, ED: Lactic Acid, Venous: 2.4 mmol/L (ref 0.5–1.9)

## 2024-06-10 LAB — ECHOCARDIOGRAM COMPLETE
Area-P 1/2: 4.39 cm2
S' Lateral: 2.2 cm

## 2024-06-10 LAB — TROPONIN I (HIGH SENSITIVITY)
Troponin I (High Sensitivity): 4 ng/L (ref <18)
Troponin I (High Sensitivity): 4 ng/L (ref <18)
Troponin I (High Sensitivity): 4 ng/L (ref <18)
Troponin I (High Sensitivity): 4 ng/L (ref <18)

## 2024-06-10 LAB — APTT: aPTT: 27 s (ref 24–36)

## 2024-06-10 LAB — D-DIMER, QUANTITATIVE: D-Dimer, Quant: 0.28 ug{FEU}/mL (ref 0.00–0.50)

## 2024-06-10 LAB — LIPID PANEL
Cholesterol: 219 mg/dL — ABNORMAL HIGH (ref 0–200)
HDL: 114 mg/dL (ref >40)
LDL Cholesterol: 94 mg/dL (ref 0–99)
Total CHOL/HDL Ratio: 1.9 ratio
Triglycerides: 57 mg/dL (ref <150)
VLDL: 11 mg/dL (ref 0–40)

## 2024-06-10 LAB — LIPASE, BLOOD: Lipase: 21 U/L (ref 11–51)

## 2024-06-10 LAB — PROTIME-INR
INR: 1 (ref 0.8–1.2)
Prothrombin Time: 13.5 s (ref 11.4–15.2)

## 2024-06-10 LAB — BRAIN NATRIURETIC PEPTIDE: B Natriuretic Peptide: 11.1 pg/mL (ref 0.0–100.0)

## 2024-06-10 LAB — ETHANOL: Alcohol, Ethyl (B): <15 mg/dL (ref <15)

## 2024-06-10 MED ORDER — ACETAMINOPHEN 650 MG RE SUPP: 650.0000 mg | Freq: Four times a day (QID) | RECTAL | Status: DC | PRN

## 2024-06-10 MED ORDER — ISOSORBIDE MONONITRATE ER 30 MG PO TB24: 30.0000 mg | ORAL_TABLET | Freq: Every day | ORAL | Status: DC

## 2024-06-10 MED ORDER — SODIUM CHLORIDE 0.9 % IV SOLN
INTRAVENOUS | Status: DC
Start: 2024-06-10 — End: 2024-06-10

## 2024-06-10 MED ORDER — ONDANSETRON HCL 4 MG/2ML IJ SOLN: 4.0000 mg | Freq: Four times a day (QID) | INTRAMUSCULAR | Status: DC | PRN

## 2024-06-10 MED ORDER — ACETAMINOPHEN 325 MG PO TABS: 650.0000 mg | ORAL_TABLET | Freq: Four times a day (QID) | ORAL | Status: DC | PRN

## 2024-06-10 MED ORDER — ONDANSETRON HCL 4 MG PO TABS: 4.0000 mg | ORAL_TABLET | Freq: Four times a day (QID) | ORAL | Status: DC | PRN

## 2024-06-10 MED ORDER — LISINOPRIL 10 MG PO TABS
5.0000 mg | ORAL_TABLET | Freq: Every day | ORAL | Status: DC
  Administered 2024-06-10: 5 mg via ORAL
  Filled 2024-06-10: qty 1

## 2024-06-10 MED ORDER — IOHEXOL 350 MG/ML SOLN
100.0000 mL | Freq: Once | INTRAVENOUS | Status: AC | PRN
  Administered 2024-06-10: 100 mL via INTRAVENOUS

## 2024-06-10 MED ORDER — MORPHINE SULFATE (PF) 4 MG/ML IV SOLN
4.0000 mg | Freq: Once | INTRAVENOUS | Status: AC
  Administered 2024-06-10: 4 mg via INTRAVENOUS
  Filled 2024-06-10: qty 1

## 2024-06-10 MED ORDER — ISOSORBIDE MONONITRATE ER 30 MG PO TB24
30.0000 mg | ORAL_TABLET | Freq: Every day | ORAL | 0 refills | Status: AC
  Filled 2024-06-10: qty 30, 30d supply, fill #0

## 2024-06-10 MED ORDER — ENOXAPARIN SODIUM 40 MG/0.4ML IJ SOSY
40.0000 mg | PREFILLED_SYRINGE | INTRAMUSCULAR | Status: DC
  Administered 2024-06-10: 40 mg via SUBCUTANEOUS
  Filled 2024-06-10: qty 0.4

## 2024-06-10 MED ORDER — LISINOPRIL 5 MG PO TABS
5.0000 mg | ORAL_TABLET | Freq: Every day | ORAL | 0 refills | Status: AC
  Filled 2024-06-10: qty 30, 30d supply, fill #0

## 2024-06-10 MED ORDER — SODIUM CHLORIDE 0.9 % IV SOLN: INTRAVENOUS | Status: DC

## 2024-06-10 NOTE — Progress Notes (Signed)
*  PRELIMINARY RESULTS* Echocardiogram 2D Echocardiogram has been performed.  [name] 06/10/2024, 1:38 PM

## 2024-06-10 NOTE — Care Management Obs Status (Signed)
 MEDICARE OBSERVATION STATUS NOTIFICATION   Patient Details  Name: [name] MRN: [id number] Date of Birth: 1956/07/31   Medicare Observation Status Notification Given:  Yes    Corean JAYSON Canary, RN 06/10/2024, 2:51 PM

## 2024-06-10 NOTE — Consult Note (Addendum)
 Cardiology Consultation   Patient ID: Camari Wisham MRN: [id number]; DOB: 04/11/1956  Admit date: 06/10/2024 Date of Consult: 06/10/2024  PCP:  [name] ORN, NP   [facility] Providers Cardiologist:  None        Patient Profile: Takeru Bose is a 68 y.o. male with a hx of active cocaine use, hypertension, hyperlipidemia who is being seen 06/10/2024 for the evaluation of chest pain at the request of the emergency department.  History of Present Illness: Mr. Lombardo patient states he was smoking cocaine today when he had a mechanical fall and developed chest pain.  He did not lose consciousness.  Patient states his chest pain was 1 out of 10 in severity and radiates to the shoulders.  This prompted an ER visit.  Upon EMS arrival, there was concern for ST elevations in the anterolateral leads.  Prompted Cath Lab activation.  Denies shortness of breath, fevers, chills, leg swelling, orthopnea.Upon reassessment of his ECG, it was determined by myself and the cardiology interventional attending that his ECG was more reflective of repolarization abnormalities.     Patient has been using cocaine actively for quite some time.  He also actively uses alcohol.  No cardiovascular imaging noted in the chart.   Past Medical History:  Diagnosis Date   Hepatitis C    History of rib fracture     Past Surgical History:  Procedure Laterality Date   ORIF ANKLE FRACTURE Left 10/15/2017   Procedure: OPEN REDUCTION INTERNAL FIXATION (ORIF) ANKLE FRACTURE;  Surgeon: Kendal Franky SQUIBB, MD;  Location: [facility] OR;  Service: Orthopedics;  Laterality: Left;   ORIF ANKLE FRACTURE Left 10/18/2017   Procedure: OPEN REDUCTION INTERNAL FIXATION (ORIF) ANKLE FRACTURE;  Surgeon: Kendal Franky SQUIBB, MD;  Location: [facility] OR;  Service: Orthopedics;  Laterality: Left;     Home Medications:  Prior to Admission medications   Not on File    Scheduled Meds:  Continuous Infusions:  PRN Meds:   Allergies:   No  Known Allergies  Social History:   Social History   Socioeconomic History   Marital status: Single    Spouse name: Not on file   Number of children: Not on file   Years of education: Not on file   Highest education level: Not on file  Occupational History   Not on file  Tobacco Use   Smoking status: Never   Smokeless tobacco: Never  Vaping Use   Vaping status: Never Used  Substance and Sexual Activity   Alcohol use: Yes    Alcohol/week: 6.0 standard drinks of alcohol    Types: 6 Cans of beer per week    Comment: pt reports drinking 6 pk everyday- states been drinking heavy since holiday   Drug use: Yes    Types: Crack cocaine    Comment: once a month   Sexual activity: Yes    Partners: Female  Other Topics Concern   Not on file  Social History Narrative   Not on file   Social Drivers of Health   Financial Resource Strain: Low Risk  (02/08/2024)   Overall Financial Resource Strain (CARDIA)    Difficulty of Paying Living Expenses: Not very hard  Food Insecurity: No Food Insecurity (02/08/2024)   Hunger Vital Sign    Worried About Running Out of Food in the Last Year: Never true    Ran Out of Food in the Last Year: Never true  Transportation Needs: No Transportation Needs (02/08/2024)   PRAPARE - Transportation  Lack of Transportation (Medical): No    Lack of Transportation (Non-Medical): No  Physical Activity: Inactive (02/08/2024)   Exercise Vital Sign    Days of Exercise per Week: 0 days    Minutes of Exercise per Session: 0 min  Stress: No Stress Concern Present (02/08/2024)   [employer] of Occupational Health - Occupational Stress Questionnaire    Feeling of Stress: Not at all  Social Connections: Socially Isolated (02/08/2024)   Social Connection and Isolation Panel    Frequency of Communication with Friends and Family: Once a week    Frequency of Social Gatherings with Friends and Family: Never    Attends Religious Services: Never    Database Administrator  or Organizations: No    Attends Banker Meetings: Never    Marital Status: Widowed  Intimate Partner Violence: Not At Risk (02/08/2024)   Humiliation, Afraid, Rape, and Kick questionnaire    Fear of Current or Ex-Partner: No    Emotionally Abused: No    Physically Abused: No    Sexually Abused: No    Family History:    Family History  Problem Relation Age of Onset   Diabetes Neg Hx    Hypertension Neg Hx      ROS:  Please see the history of present illness.   All other ROS reviewed and negative.     Physical Exam/Data: Vitals:   06/10/24 0107 06/10/24 0115 06/10/24 0130  BP: 115/89 (!) 123/90 116/89  Pulse: 98 88 81  Resp: 19 (!) 24 (!) 22  Temp: (!) 97.4 F (36.3 C)    TempSrc: Temporal    SpO2: 100% 100% 99%   No intake or output data in the 24 hours ending 06/10/24 0200    02/08/2024    1:28 PM 12/30/2023    2:17 PM 08/27/2022    1:21 PM  Last 3 Weights  Weight (lbs) 143 lb 3.2 oz 144 lb 6.4 oz 140 lb  Weight (kg) 64.955 kg 65.499 kg 63.504 kg     There is no height or weight on file to calculate BMI.  General:  Poor dentition, no acute distress HEENT: normal Neck: no JVD Vascular: No carotid bruits; Distal pulses 2+ bilaterally Cardiac:  normal S1, S2; RRR; no murmur  Lungs:  clear to auscultation bilaterally, no wheezing, rhonchi or rales  Abd: soft, nontender, no hepatomegaly  Ext: no edema Musculoskeletal:  No deformities, BUE and BLE strength normal and equal Skin: warm and dry  Neuro:  CNs 2-12 intact, no focal abnormalities noted Psych:  Normal affect   EKG:  The EKG was personally reviewed and demonstrates:  sinus rhthym Telemetry:  Telemetry was personally reviewed and demonstrates:  sinus rhythm  Relevant CV Studies: none  Laboratory Data: High Sensitivity Troponin:   Recent Labs  Lab 06/10/24 0105  TROPONINIHS 4     Chemistry Recent Labs  Lab 06/10/24 0105 06/10/24 0112  NA 141 142  K 4.3 4.3  CL 103 106  CO2 22  --    GLUCOSE 117* 115*  BUN 5* 4*  CREATININE 1.03 1.10  CALCIUM  9.3  --   GFRNONAA >60  --   ANIONGAP 16*  --     Recent Labs  Lab 06/10/24 0105  PROT 7.7  ALBUMIN 4.1  AST 21  ALT 15  ALKPHOS 79  BILITOT 0.2   Lipids  Recent Labs  Lab 06/10/24 0105  CHOL 219*  TRIG 57  HDL 114  LDLCALC 94  CHOLHDL 1.9    Hematology Recent Labs  Lab 06/10/24 0105 06/10/24 0112  WBC 4.5  --   RBC 4.35  --   HGB 13.9 13.9  HCT 40.3 41.0  MCV 92.6  --   MCH 32.0  --   MCHC 34.5  --   RDW 11.7  --   PLT 271  --    Thyroid No results for input(s): TSH, FREET4 in the last 168 hours.  BNPNo results for input(s): BNP, PROBNP in the last 168 hours.  DDimer No results for input(s): DDIMER in the last 168 hours.  Radiology/Studies:  DG Chest Portable 1 View Result Date: 06/10/2024 EXAM: 1 VIEW(S) XRAY OF THE CHEST 06/10/2024 01:37:01 AM COMPARISON: 10/14/2017 CLINICAL HISTORY: cp cp FINDINGS: LUNGS AND PLEURA: No focal pulmonary opacity. No pulmonary edema. No pleural effusion. No pneumothorax. HEART AND MEDIASTINUM: No acute abnormality of the cardiac and mediastinal silhouettes. BONES AND SOFT TISSUES: No acute osseous abnormality. IMPRESSION: 1. No acute cardiopulmonary process. Electronically signed by: Franky Crease MD 06/10/2024 01:51 AM EDT RP Workstation: [id number]     Assessment and Plan:  Talyn Dessert is a 69 y.o. male with a hx of active cocaine use, hypertension, hyperlipidemia who is being seen 06/10/2024 for the evaluation of chest pain at the request of the emergency department.  ECG reflective of repolarization abnormalities, likely in the setting of acute onset cocaine use versus LVH.  Risk factors for coronary artery disease include age, hypertension, upper lipidemia and cocaine use. His troponin thus far is flat. Start heparin if his trops significantly uptrend. Would consider stress test at some point for further evaluation. I stressed the importance of  cessation of cocaine use.   Start heparin if uptrending troponin (delta > 20) Management of cocaine use per primary team Coronary CT if patient remains admitted Will follow up troponins TTE ordered   Risk Assessment/Risk Scores:              For questions or updates, please contact [facility] Please consult [email or web address] for contact info under      Signed, Nohlan Burdin A Tana Trefry, MD  06/10/2024 2:00 AM

## 2024-06-10 NOTE — ED Notes (Signed)
 Echo at bedside

## 2024-06-10 NOTE — Consult Note (Signed)
 Initial Consultation Note       Patient: [name] FMW:[id number] DOB: [birth year] DOA: 06/10/2024 DOS: the patient was seen and examined on 06/10/2024 PCP: [name] ORN, NP  Patient coming from: Home-patient states is no longer homeless   Chief Complaint:     Chief Complaint  Patient presents with   Code STEMI    HPI: [name] is a 68 y.o. male with medical history significant of hepatitis C with associated liver fibrosis, regular alcohol use, intermittent cocaine use, schizophrenia and untreated hypertension.  Patient was brought to the ER via EMS as a code STEMI.  He was having chest pain for 1 hour.  No prior cardiac history.  Was given aspirin  and nitroglycerin on the way to the hospital.  Nitroglycerin helped with the pain.  Patient reported what sounded like a possible syncopal episode.  Upon arrival he was alert and oriented.  Since arrival his enzymes have remained within normal limits.  EKG showed changes consistent with LVH and repolarization abnormalities.  He has been evaluated by the cardiology team and ruled out STEMI.  It is suspected that chest pain and EKG was primarily related to acute cocaine use noting patient's chest pain has resolved.  Recommendations to only start heparin if troponin trends up significantly greater than 20.  Patient would likely need outpatient stress test and coronary CT. echocardiogram has been ordered.  Hospital service was asked to evaluate the patient for admission.   Upon my evaluation of the patient he again denied any current chest pain.  Admitted to using cocaine socially over the past few days.  He states he drinks at least 7 regular beers daily but does not use anything else such as weed.  DC date: 06/10/2024 Consults: Cardiology Discharge diet: Heart healthy Discharge condition: Stable  Recommendations at discharge: 1 He needs to follow-up with his primary care provider Haze [facility] nurse practitioner after discharge.  His  blood pressure was elevated and was causing EKG changes (LVH and early repolarization).  He was started you on lisinopril  which according to outpatient notes he has been previously prescribed 2 he was evaluated by cardiology during this admission.  Recommendation for eventual stress test as well as coronary CT for screening 3 echocardiogram completed this admission revealed preserved EF of 60 to 65%, no regional wall motion abnormalities and ventricular diastolic parameters consistent with grade 1 diastolic dysfunction.  No valvular abnormalities were noted.  Allergies as of 06/10/2024   No Known Allergies      Medication List     TAKE these medications    isosorbide mononitrate 30 MG 24 hr tablet Commonly known as: IMDUR Take 1 tablet (30 mg total) by mouth daily.   lisinopril  5 MG tablet Commonly known as: ZESTRIL  Take 1 tablet (5 mg total) by mouth daily. Start taking on: June 11, 2024        Discharge Instructions     Call MD for:  difficulty breathing, headache or visual disturbances   Complete by: As directed    Call MD for:  extreme fatigue   Complete by: As directed    Call MD for:  hives   Complete by: As directed    Call MD for:  persistant dizziness or light-headedness   Complete by: As directed    Call MD for:  persistant nausea and vomiting   Complete by: As directed    Call MD for:  severe uncontrolled pain   Complete by: As directed    Call  MD for:  temperature >100.4   Complete by: As directed    Diet general   Complete by: As directed    Discharge instructions   Complete by: As directed    Imdur has been added to lisinopril  Follow-up with cardiology as an outpatient if continues have chest pain Stop drug abuse  General discharge instructions: Follow with Primary MD [name] ORN, NP in 7 days  Please request your PCP  to go over your hospital tests, procedures, radiology results at the follow up. Please get your medicines reviewed and  adjusted.  Your PCP may decide to repeat certain labs or tests as needed. Do not drive, operate heavy machinery, perform activities at heights, swimming or participation in water activities or provide baby sitting services if your were admitted for syncope or siezures until you have seen by Primary MD or a Neurologist and advised to do so again. [place]  Controlled Substance Reporting System database was reviewed. Do not drive, operate heavy machinery, perform activities at heights, swim, participate in water activities or provide baby-sitting services while on medications for pain, sleep and mood until your outpatient physician has reevaluated you and advised to do so again.  You are strongly recommended to comply with the dose, frequency and duration of prescribed medications. Activity: As tolerated with Full fall precautions use walker/cane & assistance as needed Avoid using any recreational substances like cigarette, tobacco, alcohol, or non-prescribed drug. If you experience worsening of your admission symptoms, develop shortness of breath, life threatening emergency, suicidal or homicidal thoughts you must seek medical attention immediately by calling 911 or calling your MD immediately  if symptoms less severe. You must read complete instructions/literature along with all the possible adverse reactions/side effects for all the medicines you take and that have been prescribed to you. Take any new medicine only after you have completely understood and accepted all the possible adverse reactions/side effects.  Wear Seat belts while driving. You were cared for by a hospitalist during your hospital stay. If you have any questions about your discharge medications or the care you received while you were in the hospital after you are discharged, you can call the unit and ask to speak with the hospitalist or the covering physician. Once you are discharged, your primary care physician will handle any  further medical issues. Please note that NO REFILLS for any discharge medications will be authorized once you are discharged, as it is imperative that you return to your primary care physician (or establish a relationship with a primary care physician if you do not have one).   Increase activity slowly   Complete by: As directed           Review of Systems: As mentioned in the history of present illness. All other systems reviewed and are negative.      Past Medical History:  Diagnosis Date   Hepatitis C     History of rib fracture     HLD (hyperlipidemia) 06/10/2024   HTN (hypertension) 06/10/2024             Past Surgical History:  Procedure Laterality Date   ORIF ANKLE FRACTURE Left 10/15/2017    Procedure: OPEN REDUCTION INTERNAL FIXATION (ORIF) ANKLE FRACTURE;  Surgeon: Kendal Franky SQUIBB, MD;  Location: [facility] OR;  Service: Orthopedics;  Laterality: Left;   ORIF ANKLE FRACTURE Left 10/18/2017    Procedure: OPEN REDUCTION INTERNAL FIXATION (ORIF) ANKLE FRACTURE;  Surgeon: Kendal Franky SQUIBB, MD;  Location: [facility] OR;  Service:  Orthopedics;  Laterality: Left;        Social History:  reports that he has never smoked. He has never used smokeless tobacco. He reports current alcohol use of about 6.0 standard drinks of alcohol per week. He reports current drug use. Drug: Crack cocaine.   Allergies  No Known Allergies          Family History  Problem Relation Age of Onset   Diabetes Neg Hx     Hypertension Neg Hx            Prior to Admission medications   Not on File      Physical Exam:       Vitals:    06/10/24 0845 06/10/24 0927 06/10/24 0954 06/10/24 1145  BP: (!) 160/102 (!) 155/102   (!) 168/99  Pulse: 93     84  Resp: 20     19  Temp:     98.6 F (37 C)    TempSrc:     Oral    SpO2: 100%     100%    Constitutional: NAD, calm, comfortable Respiratory: clear to auscultation bilaterally, no wheezing, no crackles. Normal respiratory effort. No accessory muscle use.  RA Cardiovascular: Regular rate and rhythm, no murmurs / rubs / gallops. No extremity edema. 2+ pedal pulses. Abdomen: no tenderness, no masses palpated. No hepatosplenomegaly. Bowel sounds positive.  Musculoskeletal: no clubbing / cyanosis. No joint deformity upper and lower extremities. Good ROM, no contractures. Normal muscle tone.  Skin: no rashes, lesions, ulcers. No induration Neurologic: CN 2-12 grossly intact. Sensation intact, . Strength 5/5 x all 4 extremities.  Psychiatric: Normal judgment and insight. Alert and oriented x 3. Normal mood.       Data Reviewed:   Sodium 141, potassium 4.3, CO2 22 with an anion gap of 16, BUN 5 and creatinine 1.03, LFTs are normal   Troponin 4 x 3 collections   Lipid panel reveals a cholesterol of 219, HDL cholesterol 114, LDL cholesterol 94, triglycerides 57   WBC 4500 with normal differential, hemoglobin 13.9, platelets 271,000, coags are normal   EKG: Sinus rhythm, early repolarization V3 and V4 with prominent R waves consistent with LVH, QTc 431 ms   Assessment and Plan: Acute chest pain now resolved Suspected secondary to cocaine use EKG abnormalities more consistent with repolarization abnormality secondary to LVH Untreated hypertension likely contributing to chest pain as well Troponin has been within normal limits Echocardiogram pending Will need eventual ischemic workup in the outpatient setting   Cocaine use Patient reports he typically does not use this but has been socializing with a group of people that have been partaking of cocaine as well as offering to him over the past few days Counseled regarding cessation   Elevated anion gap metabolic acidosis Likely secondary to volume loss and altered perfusion in context of acute cocaine intoxication along with dehydration from alcohol CK normal and renal function is normal   Alcohol abuse Drinks at least 7 beers daily Counseled regarding cessation   Hypertension Patient not  currently on medications but in the past has been prescribed lisinopril  with hydrochlorothiazide  Appears dehydrated so only initiated lisinopril  at this time        Author: Isaiah Lever, NP 06/10/2024 11:53 AM

## 2024-06-10 NOTE — ED Provider Notes (Signed)
 [place] EMERGENCY DEPARTMENT AT [facility] Provider Note   CSN: [id number] Arrival date & time: 06/10/24  9896     Patient presents with: Code STEMI   [name] is a 68 y.o. male.   Patient with a history of hepatitis here with chest pain and concern for STEMI.  EMS reports onset of chest pain about 1 hour ago while he was resting.  Does admit to cocaine use.  He has an ST elevation in V3 only without reciprocal changes.  Denies cardiac history.  States he was not have any pain prior to this episode.  The pain is in the center of his chest and radiates to his bilateral shoulders.  No shortness of breath, cough or fever.  No abdominal pain, nausea or vomiting.  No pain with urination or blood in the urine.  Denies ever having an MI in the past.  The history is provided by the patient and the EMS personnel.       Prior to Admission medications   Not on File    Allergies: Patient has no known allergies.    Review of Systems  Constitutional:  Negative for activity change, appetite change and fever.  HENT:  Negative for congestion and rhinorrhea.   Respiratory:  Positive for chest tightness. Negative for shortness of breath.   Cardiovascular:  Positive for chest pain.  Gastrointestinal:  Negative for abdominal pain, nausea and vomiting.  Genitourinary:  Negative for dysuria and hematuria.  Musculoskeletal:  Negative for arthralgias and myalgias.  Skin:  Negative for rash.  Neurological:  Negative for dizziness, weakness and headaches.   all other systems are negative except as noted in the HPI and PMH.    Updated Vital Signs BP (!) 123/90   Pulse 88   Temp (!) 97.4 F (36.3 C) (Temporal)   Resp (!) 24   SpO2 100%   Physical Exam Vitals and nursing note reviewed.  Constitutional:      General: He is not in acute distress.    Appearance: He is well-developed.  HENT:     Head: Normocephalic and atraumatic.     Mouth/Throat:     Pharynx: No  oropharyngeal exudate.  Eyes:     Conjunctiva/sclera: Conjunctivae normal.     Pupils: Pupils are equal, round, and reactive to light.  Neck:     Comments: No meningismus. Cardiovascular:     Rate and Rhythm: Normal rate and regular rhythm.     Heart sounds: Normal heart sounds. No murmur heard. Pulmonary:     Effort: Pulmonary effort is normal. No respiratory distress.     Breath sounds: Normal breath sounds.  Chest:     Chest wall: No tenderness.  Abdominal:     Palpations: Abdomen is soft.     Tenderness: There is no abdominal tenderness. There is no guarding or rebound.  Musculoskeletal:        General: No tenderness. Normal range of motion.     Cervical back: Normal range of motion and neck supple.  Skin:    General: Skin is warm.  Neurological:     Mental Status: He is alert and oriented to person, place, and time.     Cranial Nerves: No cranial nerve deficit.     Motor: No abnormal muscle tone.     Coordination: Coordination normal.     Comments:  5/5 strength throughout. CN 2-12 intact.Equal grip strength.   Psychiatric:        Behavior: Behavior normal.     (  all labs ordered are listed, but only abnormal results are displayed) Labs Reviewed  COMPREHENSIVE METABOLIC PANEL WITH GFR - Abnormal; Notable for the following components:      Result Value   Glucose, Bld 117 (*)    BUN 5 (*)    Anion gap 16 (*)    All other components within normal limits  LIPID PANEL - Abnormal; Notable for the following components:   Cholesterol 219 (*)    All other components within normal limits  I-STAT CG4 LACTIC ACID, ED - Abnormal; Notable for the following components:   Lactic Acid, Venous 2.4 (*)    All other components within normal limits  I-STAT CHEM 8, ED - Abnormal; Notable for the following components:   BUN 4 (*)    Glucose, Bld 115 (*)    Calcium , Ion 1.09 (*)    All other components within normal limits  CBC WITH DIFFERENTIAL/PLATELET  PROTIME-INR  APTT   D-DIMER, QUANTITATIVE  LIPASE, BLOOD  BRAIN NATRIURETIC PEPTIDE  TROPONIN I (HIGH SENSITIVITY)  TROPONIN I (HIGH SENSITIVITY)    EKG: EKG Interpretation Date/Time:  Saturday June 10 2024 01:14:40 EDT Ventricular Rate:  88 PR Interval:  174 QRS Duration:  74 QT Interval:  354 QTC Calculation: 429 R Axis:   78  Text Interpretation: Sinus rhythm Left ventricular hypertrophy ST elevation v3 only Confirmed by Carita Senior [phone]) on 06/10/2024 1:20:35 AM  Radiology: CT Angio Chest/Abd/Pel for Dissection W and/or Wo Contrast Result Date: 06/10/2024 EXAM: CTA CHEST, ABDOMEN AND PELVIS WITH AND WITHOUT CONTRAST 06/10/2024 02:03:00 AM TECHNIQUE: CTA of the chest was performed with and without the administration of intravenous contrast. CTA of the abdomen and pelvis was performed with the administration of intravenous contrast. Multiplanar reformatted images are provided for review. MIP images are provided for review. Automated exposure control, iterative reconstruction, and/or weight based adjustment of the mA/kV was utilized to reduce the radiation dose to as low as reasonably achievable. COMPARISON: Chest radiograph from today. AP lateral chest 10/14/2017. No prior CT for comparison. CLINICAL HISTORY: Chest pain. Acute aortic syndrome (AAS) suspected. FINDINGS: VASCULATURE: AORTA: There is a small amount of calcific plaque in the distal aortic arch. The thoracic aorta is slightly tortuous and otherwise unremarkable. There are mild nonstenosing scattered calcifications in the infrarenal abdominal aorta without aneurysm, stenosis, dissection, or penetrating ulcer. PULMONARY ARTERIES: No pulmonary embolism within the limits of this exam. GREAT VESSELS OF AORTIC ARCH: The great vessels are clear with normal branching. No dissection. No arterial occlusion or significant stenosis. CELIAC TRUNK: No acute finding. No occlusion or significant stenosis. SUPERIOR MESENTERIC ARTERY: No acute finding. No  occlusion or significant stenosis. INFERIOR MESENTERIC ARTERY: No acute finding. No occlusion or significant stenosis. RENAL ARTERIES: No acute finding. No occlusion or significant stenosis. ILIAC ARTERIES: The iliac arteries are tortuous but widely patent. There is a small amount of calcific plaque in the right common iliac artery. The other iliac arteries are plaque-free. No occlusion or significant stenosis. CHEST: MEDIASTINUM: There are minimal left main coronary artery calcifications, scattered trace 2-vessel calcific plaques in the LAD and right coronary arteries. The cardiac size is normal. There is no pericardial effusion. . No arterial embolism is seen. No venous dilatation. No mediastinal lymphadenopathy. LUNGS AND PLEURA: There are mild scarring changes at the lung apices. There is a 5 mm irregular nodule in the posterior left apex on image 13 of series 8 which is probably part of the scarring process. There is no consolidation, effusion, pneumothorax,  or further nodule. The trachea and central airways are clear. THORACIC BONES AND SOFT TISSUES: There are degenerative changes of the mid to lower thoracic spine. No acute or significant osseous findings in the chest. No acute soft tissue abnormality. ABDOMEN AND PELVIS: LIVER: The liver is unremarkable. GALLBLADDER AND BILE DUCTS: Gallbladder is unremarkable. No biliary ductal dilatation. SPLEEN: The spleen is unremarkable. PANCREAS: The pancreas is unremarkable. ADRENAL GLANDS: Bilateral adrenal glands demonstrate no acute abnormality. KIDNEYS, URETERS AND BLADDER: No stones in the kidneys or ureters. No hydronephrosis. No perinephric or periureteral stranding. Urinary bladder is unremarkable. GI AND BOWEL: The esophagus is mildly patulous, most likely due to chronic dysmotility. No wall thickening is seen. There are mild thickened folds and fluid filling of the stomach but no outlet obstructing mass. The unopacified small bowel is normal caliber. The  appendix is not seen in this patient. There are no findings of acute colitis or diverticulitis. There is no bowel obstruction. No abnormal bowel wall thickening or distension. REPRODUCTIVE: The prostate is mildly enlarged. PERITONEUM AND RETROPERITONEUM: No ascites or free air. LYMPH NODES: No lymphadenopathy. ABDOMINAL BONES AND SOFT TISSUES: There are degenerative changes of the lumbar spine. No acute or other significant osseous findings. No acute soft tissue abnormality. IMPRESSION: 1. No evidence of acute aortic syndrome. 2. 5 mm irregular left apical pulmonary nodule, likely related to scarring. For patients 35 years without cancer or immunocompromise, Fleischner Society guidelines: no routine follow-up is recommended; optional non-contrast chest CT at 12 months may be considered if high-risk features are present. 3. Mild nonstenosing scattered calcifications in the infrarenal abdominal aorta without aneurysm, stenosis, dissection, or penetrating ulcer. 4. Small amount of aortic and coronary artery atherosclerosis. 5. Enlarged prostate. Electronically signed by: Francis Quam MD 06/10/2024 02:47 AM EDT RP Workstation: [id number]   DG Chest Portable 1 View Result Date: 06/10/2024 EXAM: 1 VIEW(S) XRAY OF THE CHEST 06/10/2024 01:37:01 AM COMPARISON: 10/14/2017 CLINICAL HISTORY: cp cp FINDINGS: LUNGS AND PLEURA: No focal pulmonary opacity. No pulmonary edema. No pleural effusion. No pneumothorax. HEART AND MEDIASTINUM: No acute abnormality of the cardiac and mediastinal silhouettes. BONES AND SOFT TISSUES: No acute osseous abnormality. IMPRESSION: 1. No acute cardiopulmonary process. Electronically signed by: Franky Crease MD 06/10/2024 01:51 AM EDT RP Workstation: [id number]     Procedures   Medications Ordered in the ED  0.9 %  sodium chloride  infusion (has no administration in time range)                                    Medical Decision Making Amount and/or Complexity of Data Reviewed Labs:  ordered. Decision-making details documented in ED Course. Radiology: ordered and independent interpretation performed. Decision-making details documented in ED Course. ECG/medicine tests: ordered and independent interpretation performed. Decision-making details documented in ED Course.  Risk Prescription drug management. Decision regarding hospitalization.   Presents by EMS with central chest pain onset several hours ago after using cocaine.  EKG shows ST elevation in V3 and V4 only.  No reciprocal changes.  Discussed at bedside with Dr. Gail of cardiology.  Cardiology has elected to cancel catheterization currently.  He did receive aspirin .  Will give nitroglycerin as well as check labs and chest x-ray  Patient seen at bedside with Dr. Gail of cardiology.  Favor early repolarization changes in the setting of cocaine use.  He is given aspirin  and nitroglycerin from EMS.  Reports his chest pain is  improving.  Initial troponin is negative. No evidence of aortic aneurysm or dissection.  No obvious pulmonary embolism but no negative for such.  No hypoxia or tachycardia.  Chest pain has improved after nitroglycerin and aspirin .  Troponin remains negative.  CT as above without evidence of aneurysm or dissection.  No obvious PE.  Blood pressure within normal limits.  Chest pain has improved after nitroglycerin, morphine  and aspirin .  Will admit for cardiac rule out per cardiology recommendations.  No plans for emergent catheterization.  Discussed with Dr. Dena.   Lipase minimally elevated 2.4.  Troponin negative.  CTA does show coronary and aortic calcifications without evidence of aneurysm or dissection.     Final diagnoses:  Chest pain, unspecified type    ED Discharge Orders     None          Aldous Housel, Garnette, MD 06/10/24 [phone]

## 2024-06-10 NOTE — ED Triage Notes (Signed)
 Patient BIB [facility] as code stemi. Patient reports chest pain x1 hour. Patient denies cardiac. 324 aspirin  and 1 nitroglycerin given en route. Nitroglycerin helped with pain. Patient reports falling out, however unknown. A&Ox4 VSS en route.

## 2024-06-10 NOTE — H&P (Deleted)
 History and Physical    Patient: [name] DOB: 1956/08/10 DOA: 06/10/2024 DOS: the patient was seen and examined on 06/10/2024 PCP: [name] ORN, NP  Patient coming from: Home-patient states is no longer homeless  Chief Complaint:  Chief Complaint  Patient presents with   Code STEMI   HPI: [name] is a 68 y.o. male with medical history significant of hepatitis C with associated liver fibrosis, regular alcohol use, intermittent cocaine use, schizophrenia and untreated hypertension.  Patient was brought to the ER via EMS as a code STEMI.  He was having chest pain for 1 hour.  No prior cardiac history.  Was given aspirin  and nitroglycerin on the way to the hospital.  Nitroglycerin helped with the pain.  Patient reported what sounded like a possible syncopal episode.  Upon arrival he was alert and oriented.  Since arrival his enzymes have remained within normal limits.  EKG showed changes consistent with LVH and repolarization abnormalities.  He has been evaluated by the cardiology team and ruled out STEMI.  It is suspected that chest pain and EKG was primarily related to acute cocaine use noting patient's chest pain has resolved.  Recommendations to only start heparin if troponin trends up significantly greater than 20.  Patient would likely need outpatient stress test and coronary CT. echocardiogram has been ordered.  Hospital service was asked to evaluate the patient for admission.  Upon my evaluation of the patient he again denied any current chest pain.  Admitting to using cocaine socially over the past few days.  He states he drinks at least 7 regular beers daily but does not use anything else such as weed.   Review of Systems: As mentioned in the history of present illness. All other systems reviewed and are negative. Past Medical History:  Diagnosis Date   Hepatitis C    History of rib fracture    HLD (hyperlipidemia) 06/10/2024   HTN (hypertension) 06/10/2024    Past Surgical History:  Procedure Laterality Date   ORIF ANKLE FRACTURE Left 10/15/2017   Procedure: OPEN REDUCTION INTERNAL FIXATION (ORIF) ANKLE FRACTURE;  Surgeon: Kendal Franky SQUIBB, MD;  Location: [facility] OR;  Service: Orthopedics;  Laterality: Left;   ORIF ANKLE FRACTURE Left 10/18/2017   Procedure: OPEN REDUCTION INTERNAL FIXATION (ORIF) ANKLE FRACTURE;  Surgeon: Kendal Franky SQUIBB, MD;  Location: [facility] OR;  Service: Orthopedics;  Laterality: Left;   Social History:  reports that he has never smoked. He has never used smokeless tobacco. He reports current alcohol use of about 6.0 standard drinks of alcohol per week. He reports current drug use. Drug: Crack cocaine.  No Known Allergies  Family History  Problem Relation Age of Onset   Diabetes Neg Hx    Hypertension Neg Hx     Prior to Admission medications   Not on File    Physical Exam: Vitals:   06/10/24 0845 06/10/24 0927 06/10/24 0954 06/10/24 1145  BP: (!) 160/102 (!) 155/102  (!) 168/99  Pulse: 93   84  Resp: 20   19  Temp:   98.6 F (37 C)   TempSrc:   Oral   SpO2: 100%   100%   Constitutional: NAD, calm, comfortable Respiratory: clear to auscultation bilaterally, no wheezing, no crackles. Normal respiratory effort. No accessory muscle use. RA Cardiovascular: Regular rate and rhythm, no murmurs / rubs / gallops. No extremity edema. 2+ pedal pulses. Abdomen: no tenderness, no masses palpated. No hepatosplenomegaly. Bowel sounds positive.  Musculoskeletal: no clubbing /  cyanosis. No joint deformity upper and lower extremities. Good ROM, no contractures. Normal muscle tone.  Skin: no rashes, lesions, ulcers. No induration Neurologic: CN 2-12 grossly intact. Sensation intact, . Strength 5/5 x all 4 extremities.  Psychiatric: Normal judgment and insight. Alert and oriented x 3. Normal mood.     Data Reviewed:  Sodium 141, potassium 4.3, CO2 22 with an anion gap of 16, BUN 5 and creatinine 1.03, LFTs are normal  Troponin 4 x  3 collections  Lipid panel reveals a cholesterol of 219, HDL cholesterol 114, LDL cholesterol 94, triglycerides 57  WBC 4500 with normal differential, hemoglobin 13.9, platelets 271,000, coags are normal  EKG: Sinus rhythm, early repolarization V3 and V4 with prominent R waves consistent with LVH, QTc 431 ms  Assessment and Plan: Acute chest pain now resolved Suspected secondary to cocaine use EKG abnormalities more consistent with repolarization abnormality secondary to LVH Untreated hypertension likely contributing to chest pain as well Troponin has been within normal limits Echocardiogram pending Will need eventual ischemic workup in the outpatient setting  Cocaine use Patient reports he typically does not use this but has been socializing with a group of people that have been partaking of cocaine as well as offering to him over the past few days Counseled regarding cessation  Elevated anion gap metabolic acidosis Likely secondary to volume loss and altered perfusion in context of acute cocaine intoxication along with dehydration from alcohol CK normal and renal function is normal Continue IV fluid hydration for at least 24 hours and follow-up on electrolyte panel in the morning  Alcohol abuse Drinks at least 7 beers daily Follow CIWA scores and initiate CIWA Ativan  detox if necessary Counseled regarding cessation  Hypertension Patient not currently on medications but in the past has been prescribed lisinopril  with hydrochlorothiazide  Appears dehydrated so we will only initiate lisinopril  at this time   Advance Care Planning:   Code Status: Full Code   VTE prophylaxis: Lovenox   Consults: Cardiology  Family Communication: Patient only  Severity of Illness: The appropriate patient status for this patient is INPATIENT. Inpatient status is judged to be reasonable and necessary in order to provide the required intensity of service to ensure the patient's safety. The  patient's presenting symptoms, physical exam findings, and initial radiographic and laboratory data in the context of their chronic comorbidities is felt to place them at high risk for further clinical deterioration. Furthermore, it is not anticipated that the patient will be medically stable for discharge from the hospital within 2 midnights of admission.   * I certify that at the point of admission it is my clinical judgment that the patient will require inpatient hospital care spanning beyond 2 midnights from the point of admission due to high intensity of service, high risk for further deterioration and high frequency of surveillance required.*  Author: Isaiah Lever, NP 06/10/2024 11:53 AM  For on call review [email or web address].

## 2024-06-10 NOTE — Hospital Course (Signed)
 Patient is 68 year old male presented with sudden onset chest pressure, shortness of breath, dizziness, nausea, weakness, after he took cocaine and drank alcohol.  Troponin within normal limits EKG with repolarization abnormalities. Kept on Observation to [facility] Seen by cardiology Echo with normal LV and RV function.  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  To continue lisinopril  5 mg daily Outpatient cardiology follow-up.

## 2024-06-10 NOTE — ED Notes (Signed)
 AVS with prescriptions provided to and discussed with patient. Pt verbalizes understanding of discharge instructions and denies any questions or concerns at this time. Pt ambulated out of department independently with steady gait.

## 2024-06-10 NOTE — ED Notes (Signed)
 Patient reports pain starting after taking a hit of cocaine. Pain is central chest and non-radiating.

## 2024-06-10 NOTE — Care Management CC44 (Signed)
 Condition Code 44 Documentation Completed  Patient Details  Name: [name] MRN: [id number] Date of Birth: March 15, 1956   Condition Code 44 given:  Yes Patient signature on Condition Code 44 notice:  Yes Documentation of 2 MD's agreement:  Yes Code 44 added to claim:  Yes    Corean JAYSON Canary, RN 06/10/2024, 2:51 PM

## 2024-06-10 NOTE — Progress Notes (Signed)
   Progress Note  Patient Name: [name] Date of Encounter: 06/10/2024  Primary Cardiologist: None  Briefly, 68 year old M presented with sudden onset of chest pressure, SOB, dizziness, nausea, generalized body weakness after he took cocaine and was drinking alcohol yesterday.  Chest pain lasted for quite a while.  EKG showed NSR, repolarization abnormalities. Hs troponins within normal limits.  Briefly reviewed echo images today and showed normal LV and RV function.  Chest pain resolved now.  Strongly recommended to quit cocaine and alcohol. Patient verbalized understanding.  Recommend starting Imdur 30 mg once daily due to possible cocaine induced coronary vasospasm causing chest pains and continue lisinopril  5 mg once daily.  No need of outpatient cardiology follow-up.  He can follow-up with PCP.  If he has recurrent chest pains, will need ischemia evaluation.  Cardiology will sign off.  Please call us  back if you have new questions.  Signed, Andrew SHAUNNA Maywood, MD  06/10/2024, 12:57 PM

## 2024-06-20 ENCOUNTER — Other Ambulatory Visit: Payer: Self-pay

## 2024-06-21 ENCOUNTER — Other Ambulatory Visit: Payer: Self-pay
